# Patient Record
Sex: Female | Born: 1975 | Hispanic: Yes | Marital: Married | Smoking: Never smoker
Health system: Southern US, Community
[De-identification: ages and names within clinical notes are randomized; demographics above are authoritative.]

## PROBLEM LIST (undated history)

## (undated) DIAGNOSIS — Z789 Other specified health status: Secondary | ICD-10-CM

## (undated) HISTORY — PX: BREAST SURGERY: SHX581

## (undated) HISTORY — DX: Other specified health status: Z78.9

---

## 1999-02-08 ENCOUNTER — Emergency Department (HOSPITAL_COMMUNITY): Admission: EM | Admit: 1999-02-08 | Discharge: 1999-02-08 | Payer: Self-pay | Admitting: Emergency Medicine

## 1999-02-08 ENCOUNTER — Encounter: Payer: Self-pay | Admitting: Emergency Medicine

## 2003-06-17 ENCOUNTER — Other Ambulatory Visit: Admission: RE | Admit: 2003-06-17 | Discharge: 2003-06-17 | Payer: Self-pay | Admitting: Obstetrics and Gynecology

## 2004-02-14 ENCOUNTER — Inpatient Hospital Stay (HOSPITAL_COMMUNITY): Admission: AD | Admit: 2004-02-14 | Discharge: 2004-02-16 | Payer: Self-pay | Admitting: Obstetrics and Gynecology

## 2012-05-29 ENCOUNTER — Ambulatory Visit (INDEPENDENT_AMBULATORY_CARE_PROVIDER_SITE_OTHER): Payer: Self-pay | Admitting: *Deleted

## 2012-05-29 ENCOUNTER — Encounter: Payer: Self-pay | Admitting: *Deleted

## 2012-05-29 VITALS — BP 107/70 | Ht 63.0 in | Wt 134.0 lb

## 2012-05-29 DIAGNOSIS — O09529 Supervision of elderly multigravida, unspecified trimester: Secondary | ICD-10-CM

## 2012-05-29 DIAGNOSIS — O09521 Supervision of elderly multigravida, first trimester: Secondary | ICD-10-CM

## 2012-05-30 LAB — OBSTETRIC PANEL
Antibody Screen: NEGATIVE
Basophils Absolute: 0 10*3/uL (ref 0.0–0.1)
Basophils Relative: 0 % (ref 0–1)
Eosinophils Absolute: 0 10*3/uL (ref 0.0–0.7)
Eosinophils Relative: 0 % (ref 0–5)
HCT: 38.5 % (ref 36.0–46.0)
Hemoglobin: 13.2 g/dL (ref 12.0–15.0)
Hepatitis B Surface Ag: NEGATIVE
Lymphocytes Relative: 19 % (ref 12–46)
Lymphs Abs: 1.5 10*3/uL (ref 0.7–4.0)
MCH: 31.2 pg (ref 26.0–34.0)
MCHC: 34.3 g/dL (ref 30.0–36.0)
MCV: 91 fL (ref 78.0–100.0)
Monocytes Absolute: 0.6 10*3/uL (ref 0.1–1.0)
Monocytes Relative: 8 % (ref 3–12)
Neutro Abs: 5.7 10*3/uL (ref 1.7–7.7)
Neutrophils Relative %: 73 % (ref 43–77)
Platelets: 247 10*3/uL (ref 150–400)
RBC: 4.23 MIL/uL (ref 3.87–5.11)
RDW: 13.7 % (ref 11.5–15.5)
Rh Type: POSITIVE
Rubella: 6.58 Index — ABNORMAL HIGH (ref <0.90)
WBC: 7.9 10*3/uL (ref 4.0–10.5)

## 2012-05-30 LAB — HIV ANTIBODY (ROUTINE TESTING W REFLEX): HIV: NONREACTIVE

## 2012-05-31 LAB — CULTURE, OB URINE
Colony Count: NO GROWTH
Organism ID, Bacteria: NO GROWTH

## 2012-06-06 ENCOUNTER — Encounter: Payer: Self-pay | Admitting: Obstetrics & Gynecology

## 2012-06-09 ENCOUNTER — Encounter: Payer: Self-pay | Admitting: Family Medicine

## 2012-06-16 ENCOUNTER — Ambulatory Visit (INDEPENDENT_AMBULATORY_CARE_PROVIDER_SITE_OTHER): Payer: Self-pay | Admitting: Obstetrics & Gynecology

## 2012-06-16 ENCOUNTER — Encounter: Payer: Self-pay | Admitting: Obstetrics & Gynecology

## 2012-06-16 VITALS — BP 122/78 | Temp 98.6°F | Wt 138.6 lb

## 2012-06-16 DIAGNOSIS — O09529 Supervision of elderly multigravida, unspecified trimester: Secondary | ICD-10-CM

## 2012-06-16 NOTE — Progress Notes (Signed)
P=78, States she spotted 06/14/12- denies intercourse, states had moved heavy applicance

## 2012-06-16 NOTE — Progress Notes (Signed)
   Subjective:    Tiffany [name] is a [id number] [redacted]w[redacted]d being seen today for her first obstetrical visit.  Her obstetrical history is significant for advanced maternal age. Patient does not intend to breast feed. Pregnancy history fully reviewed.  Patient reports no complaints.  Filed Vitals:   06/16/12 0946  BP: 122/78  Temp: 98.6 F (37 C)  Weight: 138 lb 9.6 oz (62.869 kg)    HISTORY: OB History   Grav Para Term Preterm Abortions TAB SAB Ect Mult Living   4 2 2  0 1 0 1 0 0 2     # Outc Date GA Lbr Len/2nd Wgt Sex Del Anes PTL Lv   1 TRM 7/97 106w0d  5lb12oz(2.608kg) M SVD EPI No Yes   2 SAB 2000 [redacted]w[redacted]d          3 TRM 11/05 [redacted]w[redacted]d  7lb(3.175kg) F SVD EPI No Yes   4 CUR              Past Medical History  Diagnosis Date  . Medical history non-contributory    Past Surgical History  Procedure Laterality Date  . Breast surgery      implants   Family History  Problem Relation Age of Onset  . Hypertension Mother   . Hyperlipidemia Mother   . Hypertension Maternal Grandmother   . Hyperlipidemia Maternal Grandmother      Exam    Uterus:     Pelvic Exam:    Perineum: No Hemorrhoids   Vulva: normal   Vagina:  normal mucosa   pH:    Cervix: anteverted   Adnexa: normal adnexa   Bony Pelvis: android  System: Breast:  normal appearance, no masses or tenderness, s/p saline augmentation   Skin: normal coloration and turgor, no rashes    Neurologic: oriented   Extremities: normal strength, tone, and muscle mass   HEENT PERRLA   Mouth/Teeth mucous membranes moist, pharynx normal without lesions   Neck supple   Cardiovascular: regular rate and rhythm   Respiratory:  appears well, vitals normal, no respiratory distress, acyanotic, normal RR, ear and throat exam is normal, neck free of mass or lymphadenopathy, chest clear, no wheezing, crepitations, rhonchi, normal symmetric air entry   Abdomen: soft, non-tender; bowel sounds normal; no masses,  no organomegaly   Urinary:  urethral meatus normal      Assessment:    Pregnancy: [id number] Patient Active Problem List  Diagnosis  . Supervision of other normal pregnancy  . AMA (advanced maternal age) multigravida 35+        Plan:     Initial labs drawn. Prenatal vitamins. Problem list reviewed and updated. Genetic Screening discussed Quad Screen and Harm[facility]: declined.  Ultrasound discussed; fetal survey: requested and ordered.  Follow up in 4 weeks.   [name],[name]. 06/16/2012

## 2012-06-21 ENCOUNTER — Encounter (HOSPITAL_COMMUNITY): Payer: Self-pay | Admitting: *Deleted

## 2012-06-21 ENCOUNTER — Other Ambulatory Visit: Payer: Self-pay | Admitting: Obstetrics & Gynecology

## 2012-06-21 ENCOUNTER — Inpatient Hospital Stay (HOSPITAL_COMMUNITY)
Admission: AD | Admit: 2012-06-21 | Discharge: 2012-06-21 | Disposition: A | Discharge status: Home / Self Care | Payer: Self-pay | Attending: Obstetrics & Gynecology | Admitting: Obstetrics & Gynecology

## 2012-06-21 ENCOUNTER — Ambulatory Visit (HOSPITAL_COMMUNITY)
Admission: RE | Admit: 2012-06-21 | Discharge: 2012-06-21 | Disposition: A | Discharge status: Home / Self Care | Payer: Self-pay | Source: Ambulatory Visit | Attending: Obstetrics & Gynecology | Admitting: Obstetrics & Gynecology

## 2012-06-21 DIAGNOSIS — O021 Missed abortion: Secondary | ICD-10-CM | POA: Insufficient documentation

## 2012-06-21 DIAGNOSIS — O209 Hemorrhage in early pregnancy, unspecified: Secondary | ICD-10-CM | POA: Insufficient documentation

## 2012-06-21 DIAGNOSIS — Z3689 Encounter for other specified antenatal screening: Secondary | ICD-10-CM | POA: Insufficient documentation

## 2012-06-21 MED ORDER — MISOPROSTOL 200 MCG PO TABS: 200.0000 ug | ORAL_TABLET | ORAL | Status: DC

## 2012-06-21 MED ORDER — IBUPROFEN 600 MG PO TABS: 600.0000 mg | ORAL_TABLET | Freq: Four times a day (QID) | ORAL | Status: DC | PRN

## 2012-06-21 MED ORDER — OXYCODONE-ACETAMINOPHEN 5-325 MG PO TABS: 1.0000 | ORAL_TABLET | ORAL | Status: DC | PRN

## 2012-06-21 MED ORDER — PROMETHAZINE HCL 12.5 MG PO TABS: 12.5000 mg | ORAL_TABLET | Freq: Four times a day (QID) | ORAL | Status: DC | PRN

## 2012-06-21 NOTE — MAU Provider Note (Signed)
Chief Complaint: No fetal heart beat    First Provider Initiated Contact with Patient 06/21/12 1901     SUBJECTIVE HPI: Tiffany [name] is a 37 y.o. [id number] at [redacted]w[redacted]d by LMP who had routine [place] today showing fetal pole [redacted]w[redacted]d by CRL and absent cardiac activity. She had care at [facility] 05/29/12 for PN labs ( O pos) and 06/16/12 for NOB exam. She had one episode of light pink spotting about a week ago. No cramping or illness. Planned pregnancy.  Conferred with Dr. Shawnie Pons earlier today after the [place], called [facility] and was told to come here.   Past Medical History  Diagnosis Date  . Medical history non-contributory    OB History   Grav Para Term Preterm Abortions TAB SAB Ect Mult Living   4 2 2  0 1 0 1 0 0 2     # Outc Date GA Lbr Len/2nd Wgt Sex Del Anes PTL Lv   1 TRM 7/97 [redacted]w[redacted]d  2.608kg(5lb12oz) M SVD EPI No Yes   2 SAB 2000 [redacted]w[redacted]d          3 TRM 11/05 [redacted]w[redacted]d  3.175kg(7lb) F SVD EPI No Yes   4 CUR              Past Surgical History  Procedure Laterality Date  . Breast surgery      implants   History   Social History  . Marital Status: Married    Spouse Name: N/A    Number of Children: N/A  . Years of Education: N/A   Occupational History  . Not on file.   Social History Main Topics  . Smoking status: Never Smoker   . Smokeless tobacco: Never Used  . Alcohol Use: No  . Drug Use: No  . Sexually Active: Yes    Birth Control/ Protection: None   Other Topics Concern  . Not on file   Social History Narrative  . No narrative on file   No current facility-administered medications on file prior to encounter.   Current Outpatient Prescriptions on File Prior to Encounter  Medication Sig Dispense Refill  . calcium carbonate (TUMS - DOSED IN MG ELEMENTAL CALCIUM) 500 MG chewable tablet Chew 1 tablet by mouth daily.      . Docosahexaenoic Acid (PRENATAL DHA PO) Take by mouth.      . famotidine (PEPCID) 20 MG tablet Take 20 mg by mouth 2 (two) times daily.       No Known  Allergies  ROS: Pertinent items in HPI  OBJECTIVE Blood pressure 123/72, pulse 98, temperature 97.3 F (36.3 C), temperature source Oral, resp. rate 18, last menstrual period 02/28/2012. GENERAL: Well-developed, well-nourished female emotionally distressed.  HEENT: Normocephalic HEART: normal rate RESP: normal effort ABDOMEN: Soft, non-tender EXTREMITIES: Nontender, no edema NEURO: Alert and oriented  LAB RESULTS No results found for this or any previous visit (from the past 24 hour(s)).  IMAGING US Ob Comp Less 14 Wks  06/21/2012  OBSTETRICAL ULTRASOUND: This exam was performed within a [facility]. The OB US report was generated in the [facility] system, and faxed to the ordering physician.   This report is also available in [employer] and in the [employer]. See [facility] Obstetric US report.    MAU COURSE Counseled at length about options including expectant management, cytotec, D&C. All questions answered and they elect cytotetc.  ASSESSMENT 1. AMA (advanced maternal age) multigravida 35+, first trimester   2. [name]  37 yo [id number] with [redacted]w[redacted]d demise   PLAN Discharge home    Medication List    TAKE these medications       calcium carbonate 500 MG chewable tablet  Commonly known as:  TUMS - dosed in mg elemental calcium  Chew 1 tablet by mouth daily.     famotidine 20 MG tablet  Commonly known as:  PEPCID  Take 20 mg by mouth 2 (two) times daily.     ibuprofen 600 MG tablet  Commonly known as:  ADVIL,MOTRIN  Take 1 tablet (600 mg total) by mouth every 6 (six) hours as needed for pain.     misoprostol 200 MCG tablet  Commonly known as:  CYTOTEC  Take 1 tablet (200 mcg total) by mouth 1 day or 1 dose.     oxyCODONE-acetaminophen 5-325 MG per tablet  Commonly known as:  PERCOCET/ROXICET  Take 1 tablet by mouth every 4 (four) hours as needed for pain.     PRENATAL DHA PO  Take by mouth.     promethazine 12.5 MG  tablet  Commonly known as:  PHENERGAN  Take 1 tablet (12.5 mg total) by mouth every 6 (six) hours as needed for nausea.            Early Intrauterine Pregnancy Failure  _x__  Documented intrauterine pregnancy failure less than or equal to [redacted] weeks gestation  _x_  No serious current illness  _x__  Baseline Hgb greater than or equal to 10g/dl  _x__  Patient has easily accessible transportation to the hospital  _x__  Clear preference  _x__  Practitioner/physician deems patient reliable  _x__  Counseling by practitioner or physician  _x__  Patient education by RN  __x_  Consent form signed  _NI__  Rho-Gam given by RN if indicated  _x__ Medication dispensed   _x__   Cytotec 800 mcg  x_   Intravaginally by patient at home         __   Intravaginally by RN in MAU        __   Rectally by patient at home        __   Rectally by RN in MAU  _x__  Ibuprofen 600 mg 1 tablet by mouth every 6 hours as needed #30  _x__  Hydrocodone/acetaminophen 5/325 mg by mouth every 4 to 6 hours as needed  _x__  Phenergan 12.5 mg by mouth every 4 hours as needed for nausea  Follow-up Information   Follow up with [facility] In 2 weeks. (Someone from the office will call with an appointment)    Contact information:   [address] [place] [address] [phone]        [name], CNM 06/21/2012  7:15 PM

## 2012-06-21 NOTE — MAU Note (Signed)
[facility], [facility] called her and told her there was no heart beat and to come into the hospital.

## 2012-06-21 NOTE — MAU Note (Signed)
Comfort material given to pt.  Miscarriage discharge instructions reviewed in detail.  Pt voices understanding, tearful about loss.

## 2012-07-14 ENCOUNTER — Encounter: Payer: Self-pay | Admitting: Obstetrics & Gynecology

## 2012-07-26 ENCOUNTER — Ambulatory Visit (INDEPENDENT_AMBULATORY_CARE_PROVIDER_SITE_OTHER): Payer: Self-pay | Admitting: Family Medicine

## 2012-07-26 ENCOUNTER — Ambulatory Visit (HOSPITAL_COMMUNITY)
Admission: RE | Admit: 2012-07-26 | Discharge: 2012-07-26 | Disposition: A | Discharge status: Home / Self Care | Payer: MEDICAID | Source: Ambulatory Visit | Attending: Family Medicine | Admitting: Family Medicine

## 2012-07-26 ENCOUNTER — Encounter: Payer: Self-pay | Admitting: Family Medicine

## 2012-07-26 VITALS — BP 132/94 | HR 82 | Ht 63.0 in | Wt 139.0 lb

## 2012-07-26 DIAGNOSIS — N949 Unspecified condition associated with female genital organs and menstrual cycle: Secondary | ICD-10-CM | POA: Insufficient documentation

## 2012-07-26 DIAGNOSIS — IMO0002 Reserved for concepts with insufficient information to code with codable children: Secondary | ICD-10-CM | POA: Insufficient documentation

## 2012-07-26 NOTE — Patient Instructions (Signed)
Miscarriage A miscarriage is the sudden loss of an unborn baby (fetus) before the 20th week of pregnancy. Most miscarriages happen in the first 3 months of pregnancy. Sometimes, it happens before a woman even knows she is pregnant. A miscarriage is also called a "spontaneous miscarriage" or "early pregnancy loss." Having a miscarriage can be an emotional experience. Talk with your caregiver about any questions you may have about miscarrying, the grieving process, and your future pregnancy plans. CAUSES   Problems with the fetal chromosomes that make it impossible for the baby to develop normally. Problems with the baby's genes or chromosomes are most often the result of errors that occur, by chance, as the embryo divides and grows. The problems are not inherited from the parents.  Infection of the cervix or uterus.   Hormone problems.   Problems with the cervix, such as having an incompetent cervix. This is when the tissue in the cervix is not strong enough to hold the pregnancy.   Problems with the uterus, such as an abnormally shaped uterus, uterine fibroids, or congenital abnormalities.   Certain medical conditions.   Smoking, drinking alcohol, or taking illegal drugs.   Trauma.  Often, the cause of a miscarriage is unknown.  SYMPTOMS   Vaginal bleeding or spotting, with or without cramps or pain.  Pain or cramping in the abdomen or lower back.  Passing fluid, tissue, or blood clots from the vagina. DIAGNOSIS  Your caregiver will perform a physical exam. You may also have an ultrasound to confirm the miscarriage. Blood or urine tests may also be ordered. TREATMENT   Sometimes, treatment is not necessary if you naturally pass all the fetal tissue that was in the uterus. If some of the fetus or placenta remains in the body (incomplete miscarriage), tissue left behind may become infected and must be removed. Usually, a dilation and curettage (D and C) procedure is performed.  During a D and C procedure, the cervix is widened (dilated) and any remaining fetal or placental tissue is gently removed from the uterus.  Antibiotic medicines are prescribed if there is an infection. Other medicines may be given to reduce the size of the uterus (contract) if there is a lot of bleeding.  If you have Rh negative blood and your baby was Rh positive, you will need a Rh immunoglobulin shot. This shot will protect any future baby from having Rh blood problems in future pregnancies. HOME CARE INSTRUCTIONS   Your caregiver may order bed rest or may allow you to continue light activity. Resume activity as directed by your caregiver.  Have someone help with home and family responsibilities during this time.   Keep track of the number of sanitary pads you use each day and how soaked (saturated) they are. Write down this information.   Do not use tampons. Do not douche or have sexual intercourse until approved by your caregiver.   Only take over-the-counter or prescription medicines for pain or discomfort as directed by your caregiver.   Do not take aspirin. Aspirin can cause bleeding.   Keep all follow-up appointments with your caregiver.   If you or your partner have problems with grieving, talk to your caregiver or seek counseling to help cope with the pregnancy loss. Allow enough time to grieve before trying to get pregnant again.  SEEK IMMEDIATE MEDICAL CARE IF:   You have severe cramps or pain in your back or abdomen.  You have a fever.  You pass large blood clots (walnut-sized   or larger) ortissue from your vagina. Save any tissue for your caregiver to inspect.   Your bleeding increases.   You have a thick, bad-smelling vaginal discharge.  You become lightheaded, weak, or you faint.   You have chills.  MAKE SURE YOU:  Understand these instructions.  Will watch your condition.  Will get help right away if you are not doing well or get  worse. Document Released: 09/15/2000 Document Revised: 09/21/2011 Document Reviewed: 05/11/2011 [employer] Patient Information 2013 [place], [place]. Abnormal Uterine Bleeding Abnormal uterine bleeding can have many causes. Some cases are simply treated, while others are more serious. There are several kinds of bleeding that is considered abnormal, including:  Bleeding between periods.  Bleeding after sexual intercourse.  Spotting anytime in the menstrual cycle.  Bleeding heavier or more than normal.  Bleeding after menopause. CAUSES  There are many causes of abnormal uterine bleeding. It can be present in teenagers, pregnant women, women during their reproductive years, and women who have reached menopause. Your caregiver will look for the more common causes depending on your age, signs, symptoms and your particular circumstance. Most cases are not serious and can be treated. Even the more serious causes, like cancer of the female organs, can be treated adequately if found in the early stages. That is why all types of bleeding should be evaluated and treated as soon as possible. DIAGNOSIS  Diagnosing the cause may take several kinds of tests. Your caregiver may:  Take a complete history of the type of bleeding.  Perform a complete physical exam and Pap smear.  Take an ultrasound on the abdomen showing a picture of the female organs and the pelvis.  Inject dye into the uterus and Fallopian tubes and X-ray them (hysterosalpingogram).  Place fluid in the uterus and do an ultrasound (sonohysterogrqphy).  Take a CT scan to examine the female organs and pelvis.  Take an MRI to examine the female organs and pelvis. There is no X-ray involved with this procedure.  Look inside the uterus with a telescope that has a light at the end (hysteroscopy).  Scrap the inside of the uterus to get tissue to examine (Dilatation and Curettage, D&C).  Look into the pelvis with a telescope that has a  light at the end (laparoscopy). This is done through a very small cut (incision) in the abdomen. TREATMENT  Treatment will depend on the cause of the abnormal bleeding. It can include:  Doing nothing to allow the problem to take care of itself over time.  Hormone treatment.  Birth control pills.  Treating the medical condition causing the problem.  Laparoscopy.  Major or minor surgery  Destroying the lining of the uterus with electrical currant, laser, freezing or heat (uterine ablation). HOME CARE INSTRUCTIONS   Follow your caregiver's recommendation on how to treat your problem.  See your caregiver if you missed a menstrual period and think you may be pregnant.  If you are bleeding heavily, count the number of pads/tampons you use and how often you have to change them. Tell this to your caregiver.  Avoid sexual intercourse until the problem is controlled. SEEK MEDICAL CARE IF:   You have any kind of abnormal bleeding mentioned above.  You feel dizzy at times.  You are 37 years old and have not had a menstrual period yet. SEEK IMMEDIATE MEDICAL CARE IF:   You pass out.  You are changing pads/tampons every 15 to 30 minutes.  You have belly (abdominal) pain.  You have  a temperature of 100 F (37.8 C) or higher.  You become sweaty or weak.  You are passing large blood clots from the vagina.  You start to feel sick to your stomach (nauseous) and throw up (vomit). Document Released: 03/22/2005 Document Revised: 06/14/2011 Document Reviewed: 08/15/2008 [facility] Patient Information 2013 [place], [place].

## 2012-07-26 NOTE — Progress Notes (Signed)
Patient ID: Tiffany [name], female   DOB: January 17, 1976, 37 y.o.   MRN: [id number]  Called pt. By phone to discuss options--appears to have retained POC by u/s--small amount 1.5 cm.  Advised of 2nd round of cytotec vs D and C.  Risks of each discussed.  She will discuss with partner and call us with her decision in the am.

## 2012-07-26 NOTE — Progress Notes (Signed)
  Subjective:    Patient ID: Tiffany [name], female    DOB: [birth year], 37 y.o.   MRN: [id number]  HPI Had 9 wk SAB diagnosed at anatomy scan.  S/p cytotec with heavy bleeding and tissue passage.  No bleeding for a couple of weeks, then now with continued bleeding, worse with activity and pain.     Review of Systems  Constitutional: Negative for fever and chills.  Gastrointestinal: Positive for abdominal pain. Negative for nausea and vomiting.  Genitourinary: Positive for pelvic pain.       Objective:   Physical Exam  Constitutional: She appears well-developed and well-nourished.  HENT:  Head: Normocephalic and atraumatic.  Neck: Neck supple.  Cardiovascular: Normal rate.   Pulmonary/Chest: Effort normal.  Abdominal: Soft.          Assessment & Plan:

## 2012-07-26 NOTE — Assessment & Plan Note (Signed)
?   Retained POC.  Will check U/s and phone with results and next steps.  Discussed no pregnancy for 2-3 cycles.  She is not interested in [facility] at this time.  Risks associated with age discussed.

## 2012-07-27 ENCOUNTER — Telehealth: Payer: Self-pay

## 2012-07-27 NOTE — Telephone Encounter (Signed)
Scheduled for D andC on 07/28/2012 at 2:30.  Pt. Called and instructed to be NPO for 8 hours prior to surgery and be here at 1 pm.

## 2012-07-27 NOTE — Telephone Encounter (Signed)
Hi Dr. Shawnie Pons,  This patient had her ultrasound done yesterday and was told she still has some tissue discharge coming out from her SAB . She wants to go ahead to have a D&C done and she would like for you to perform it please. Please let me know when and what time this would be scheduled so I can call patient and advice, thanks :)

## 2012-07-28 ENCOUNTER — Ambulatory Visit (HOSPITAL_COMMUNITY): Payer: Self-pay | Admitting: Anesthesiology

## 2012-07-28 ENCOUNTER — Encounter (HOSPITAL_COMMUNITY): Payer: Self-pay | Admitting: *Deleted

## 2012-07-28 ENCOUNTER — Ambulatory Visit (HOSPITAL_COMMUNITY)
Admission: RE | Admit: 2012-07-28 | Discharge: 2012-07-28 | Disposition: A | Discharge status: Home / Self Care | Payer: MEDICAID | Source: Ambulatory Visit | Attending: Family Medicine | Admitting: Family Medicine

## 2012-07-28 ENCOUNTER — Encounter (HOSPITAL_COMMUNITY): Payer: Self-pay | Admitting: Anesthesiology

## 2012-07-28 ENCOUNTER — Encounter (HOSPITAL_COMMUNITY)
Admission: RE | Disposition: A | Discharge status: Home / Self Care | Payer: Self-pay | Source: Ambulatory Visit | Attending: Family Medicine

## 2012-07-28 DIAGNOSIS — O021 Missed abortion: Secondary | ICD-10-CM | POA: Insufficient documentation

## 2012-07-28 HISTORY — PX: DILATION AND EVACUATION: SHX1459

## 2012-07-28 LAB — CBC
HCT: 41.3 % (ref 36.0–46.0)
MCH: 31.5 pg (ref 26.0–34.0)
MCHC: 34.4 g/dL (ref 30.0–36.0)
MCV: 91.6 fL (ref 78.0–100.0)
RDW: 12.3 % (ref 11.5–15.5)

## 2012-07-28 SURGERY — DILATION AND EVACUATION, UTERUS
Anesthesia: Monitor Anesthesia Care | Site: Vagina | Wound class: Clean Contaminated

## 2012-07-28 MED ORDER — DOXYCYCLINE HYCLATE 100 MG PO CAPS: 100.0000 mg | ORAL_CAPSULE | Freq: Two times a day (BID) | ORAL | Status: DC

## 2012-07-28 MED ORDER — LIDOCAINE HCL (CARDIAC) 20 MG/ML IV SOLN
INTRAVENOUS | Status: AC
  Filled 2012-07-28: qty 5

## 2012-07-28 MED ORDER — DEXAMETHASONE SODIUM PHOSPHATE 10 MG/ML IJ SOLN
INTRAMUSCULAR | Status: AC
  Filled 2012-07-28: qty 1

## 2012-07-28 MED ORDER — ONDANSETRON HCL 4 MG/2ML IJ SOLN
INTRAMUSCULAR | Status: DC | PRN
  Administered 2012-07-28: 4 mg via INTRAVENOUS

## 2012-07-28 MED ORDER — FENTANYL CITRATE 0.05 MG/ML IJ SOLN
INTRAMUSCULAR | Status: AC
  Filled 2012-07-28: qty 5

## 2012-07-28 MED ORDER — ONDANSETRON HCL 4 MG/2ML IJ SOLN
INTRAMUSCULAR | Status: AC
  Filled 2012-07-28: qty 2

## 2012-07-28 MED ORDER — KETOROLAC TROMETHAMINE 30 MG/ML IJ SOLN
INTRAMUSCULAR | Status: DC | PRN
  Administered 2012-07-28: 30 mg via INTRAVENOUS

## 2012-07-28 MED ORDER — DEXAMETHASONE SODIUM PHOSPHATE 10 MG/ML IJ SOLN
INTRAMUSCULAR | Status: DC | PRN
  Administered 2012-07-28: 10 mg via INTRAVENOUS

## 2012-07-28 MED ORDER — LACTATED RINGERS IV SOLN
INTRAVENOUS | Status: DC
  Administered 2012-07-28: 125 mL/h via INTRAVENOUS

## 2012-07-28 MED ORDER — KETOROLAC TROMETHAMINE 30 MG/ML IJ SOLN
INTRAMUSCULAR | Status: AC
  Filled 2012-07-28: qty 1

## 2012-07-28 MED ORDER — FENTANYL CITRATE 0.05 MG/ML IJ SOLN
INTRAMUSCULAR | Status: DC | PRN
  Administered 2012-07-28 (×3): 50 ug via INTRAVENOUS

## 2012-07-28 MED ORDER — FENTANYL CITRATE 0.05 MG/ML IJ SOLN: 25.0000 ug | INTRAMUSCULAR | Status: DC | PRN

## 2012-07-28 MED ORDER — MIDAZOLAM HCL 5 MG/5ML IJ SOLN
INTRAMUSCULAR | Status: DC | PRN
  Administered 2012-07-28: 2 mg via INTRAVENOUS

## 2012-07-28 MED ORDER — PROPOFOL 10 MG/ML IV BOLUS
INTRAVENOUS | Status: DC | PRN
  Administered 2012-07-28: 20 mg via INTRAVENOUS

## 2012-07-28 MED ORDER — BUPIVACAINE-EPINEPHRINE PF 0.25-1:200000 % IJ SOLN
INTRAMUSCULAR | Status: AC
  Filled 2012-07-28: qty 30

## 2012-07-28 MED ORDER — LIDOCAINE HCL (CARDIAC) 20 MG/ML IV SOLN
INTRAVENOUS | Status: DC | PRN
  Administered 2012-07-28: 40 mg via INTRAVENOUS

## 2012-07-28 MED ORDER — DOXYCYCLINE HYCLATE 100 MG IV SOLR
100.0000 mg | Freq: Once | INTRAVENOUS | Status: AC
  Administered 2012-07-28: 100 mg via INTRAVENOUS
  Filled 2012-07-28: qty 100

## 2012-07-28 MED ORDER — PROPOFOL 10 MG/ML IV EMUL
INTRAVENOUS | Status: AC
  Filled 2012-07-28: qty 20

## 2012-07-28 MED ORDER — MIDAZOLAM HCL 2 MG/2ML IJ SOLN
INTRAMUSCULAR | Status: AC
  Filled 2012-07-28: qty 2

## 2012-07-28 MED ORDER — BUPIVACAINE-EPINEPHRINE 0.25% -1:200000 IJ SOLN
INTRAMUSCULAR | Status: DC | PRN
  Administered 2012-07-28: 20 mL

## 2012-07-28 SURGICAL SUPPLY — 22 items
CATH ROBINSON RED A/P 16FR (CATHETERS) ×2 IMPLANT
CLOTH BEACON ORANGE TIMEOUT ST (SAFETY) ×2 IMPLANT
DECANTER SPIKE VIAL GLASS SM (MISCELLANEOUS) ×2 IMPLANT
GLOVE BIOGEL PI IND STRL 7.0 (GLOVE) ×1 IMPLANT
GLOVE BIOGEL PI INDICATOR 7.0 (GLOVE) ×1
GLOVE ECLIPSE 7.0 STRL STRAW (GLOVE) ×4 IMPLANT
GOWN PREVENTION PLUS XLARGE (GOWN DISPOSABLE) ×2 IMPLANT
GOWN STRL REIN XL XLG (GOWN DISPOSABLE) ×4 IMPLANT
KIT BERKELEY 1ST TRIMESTER 3/8 (MISCELLANEOUS) ×2 IMPLANT
NDL SPNL 22GX3.5 QUINCKE BK (NEEDLE) ×1 IMPLANT
NEEDLE SPNL 22GX3.5 QUINCKE BK (NEEDLE) ×2 IMPLANT
NS IRRIG 1000ML POUR BTL (IV SOLUTION) ×2 IMPLANT
PACK VAGINAL MINOR WOMEN LF (CUSTOM PROCEDURE TRAY) ×2 IMPLANT
PAD OB MATERNITY 4.3X12.25 (PERSONAL CARE ITEMS) ×2 IMPLANT
PAD PREP 24X48 CUFFED NSTRL (MISCELLANEOUS) ×2 IMPLANT
SET BERKELEY SUCTION TUBING (SUCTIONS) ×2 IMPLANT
SYR CONTROL 10ML LL (SYRINGE) ×2 IMPLANT
TOWEL OR 17X24 6PK STRL BLUE (TOWEL DISPOSABLE) ×4 IMPLANT
VACURETTE 10 RIGID CVD (CANNULA) IMPLANT
VACURETTE 7MM CVD STRL WRAP (CANNULA) IMPLANT
VACURETTE 8 RIGID CVD (CANNULA) IMPLANT
VACURETTE 9 RIGID CVD (CANNULA) ×1 IMPLANT

## 2012-07-28 NOTE — Transfer of Care (Signed)
Immediate Anesthesia Transfer of Care Note  Patient: Tiffany [name]  Procedure(s) Performed: Procedure(s): DILATATION AND EVACUATION (N/A)  Patient Location: PACU  Anesthesia Type:MAC  Level of Consciousness: sedated  Airway & Oxygen Therapy: Patient Spontanous Breathing  Post-op Assessment: Report given to PACU RN  Post vital signs: Reviewed  Complications: No apparent anesthesia complications

## 2012-07-28 NOTE — Preoperative (Signed)
Beta Blockers   Reason not to administer Beta Blockers:Not Applicable 

## 2012-07-28 NOTE — Anesthesia Postprocedure Evaluation (Signed)
  Anesthesia Post-op Note  Patient: Tiffany [name]  Procedure(s) Performed: Procedure(s): DILATATION AND EVACUATION (N/A) Patient is awake and responsive. Pain and nausea are reasonably well controlled. Vital signs are stable and clinically acceptable. Oxygen saturation is clinically acceptable. There are no apparent anesthetic complications at this time. Patient is ready for discharge.

## 2012-07-28 NOTE — H&P (Signed)
Tiffany [name] is an 37 y.o. [phone] Unknown female.   Chief Complaint: Bleeding HPI: 43 y.M.Tiffany [name] with missed AB at 9 wks, who had cytotec and continued bleeding and pain x 4 wks after cytotec.  Past Medical History  Diagnosis Date  . Medical history non-contributory     Past Surgical History  Procedure Laterality Date  . Breast surgery      implants    Family History  Problem Relation Age of Onset  . Hypertension Mother   . Hyperlipidemia Mother   . Hypertension Maternal Grandmother   . Hyperlipidemia Maternal Grandmother    Social History:  reports that she has never smoked. She has never used smokeless tobacco. She reports that she does not drink alcohol or use illicit drugs.  Allergies: No Known Allergies  Medications Prior to Admission  Medication Sig Dispense Refill  . calcium carbonate (TUMS - DOSED IN MG ELEMENTAL CALCIUM) 500 MG chewable tablet Chew 1 tablet by mouth daily.      . Docosahexaenoic Acid (PRENATAL DHA PO) Take by mouth.      . famotidine (PEPCID) 20 MG tablet Take 20 mg by mouth 2 (two) times daily.      Marland Kitchen ibuprofen (ADVIL,MOTRIN) 600 MG tablet Take 1 tablet (600 mg total) by mouth every 6 (six) hours as needed for pain.  30 tablet  1     Pertinent items are noted in HPI.  Blood pressure 102/86, pulse 101, temperature 98.4 F (36.9 C), temperature source Oral, resp. rate 20, height 5\' 3"  (1.6 m), weight 140 lb (63.504 kg), last menstrual period 02/28/2012, SpO2 100.00%. BP 102/86  Pulse 101  Temp(Src) 98.4 F (36.9 C) (Oral)  Resp 20  Ht 5\' 3"  (1.6 m)  Wt 140 lb (63.504 kg)  BMI 24.81 kg/m2  SpO2 100%  LMP 02/28/2012 General appearance: alert, cooperative and appears stated age Head: Normocephalic, without obvious abnormality, atraumatic Neck: no adenopathy, supple, symmetrical, trachea midline and thyroid not enlarged, symmetric, no tenderness/mass/nodules Lungs: clear to auscultation bilaterally Heart: regular rate and rhythm, S1,  S2 normal, no murmur, click, rub or gallop Abdomen: soft, non-tender; bowel sounds normal; no masses,  no organomegaly Pelvic: cervix normal in appearance, external genitalia normal, no adnexal masses or tenderness, uterus normal size, shape, and consistency, vagina normal without discharge and uterus is tender. Extremities: extremities normal, atraumatic, no cyanosis or edema Skin: Skin color, texture, turgor normal. No rashes or lesions Neurologic: Grossly normal   Lab Results  Component Value Date   WBC 8.0 07/28/2012   HGB 14.2 07/28/2012   HCT 41.3 07/28/2012   MCV 91.6 07/28/2012   PLT 250 07/28/2012   No results found for this basename: PREGTESTUR, PREGSERUM, HCG, HCGQUANT    US Transvaginal Non-ob  07/26/2012  *RADIOLOGY REPORT*  Clinical Data: Persistent vaginal bleeding pelvic pain.  Recent Cytotec for missed abortion.  Evaluate for retained products.  TRANSVAGINAL ULTRASOUND OF PELVIS  Technique:  Transvaginal ultrasound examination of the pelvis was performed including evaluation of the uterus, ovaries, adnexal regions, and pelvic cul-de-sac.  Comparison:  06/21/2012  Findings:  Uterus:     8.9 x 4.8 x 6.0 cm.  No fibroids identified.  Endometrium:      Thickened and inhomogeneous endometrial stripe is seen which measures up to 1.8 cm transvaginally.  Increased vascularity is also seen in the fundal portion of the endometrial cavity on color Dopplerultrasound.  These findings are suggestive of retained products of conception.  Right Ovary: 3.6 x 2.3 x  2.0 cm.  Normal appearance.  Left Ovary: 2.8 x 1.5 x 2.0 cm.  Normal appearance.  Other Findings:  No free fluid.  IMPRESSION:  1.  Thickened endometrium with internal vascularity in the fundal region, suspicious for retained products of conception. 2.  No fibroids or adnexal mass identified.   Original Report Authenticated By: Myles Rosenthal, M.D.    Assessment/Plan Patient Active Problem List  Diagnosis  . Other disorder of menstruation  and other abnormal bleeding from female genital tract  . Retained products of conception with hemorrhage  Suction Dilation and Curettage. Risks include but are not limited to bleeding, infection, injury to surrounding structures, including bowel, bladder and ureters, blood clots, and death.  Likelihood of success is high.   Tiffany [name] S 07/28/2012, 2:08 PM

## 2012-07-28 NOTE — Anesthesia Preprocedure Evaluation (Signed)
Anesthesia Evaluation  Patient identified by MRN, date of birth, ID band Patient awake    Reviewed: Allergy & Precautions, H&P , Patient's Chart, lab work & pertinent test results, reviewed documented beta blocker date and time   Airway Mallampati: II TM Distance: >3 FB Neck ROM: full    Dental no notable dental hx.    Pulmonary  breath sounds clear to auscultation  Pulmonary exam normal       Cardiovascular Rhythm:regular Rate:Normal     Neuro/Psych    GI/Hepatic   Endo/Other    Renal/GU      Musculoskeletal   Abdominal   Peds  Hematology   Anesthesia Other Findings   Reproductive/Obstetrics                           Anesthesia Physical Anesthesia Plan  ASA: II  Anesthesia Plan: MAC   Post-op Pain Management:    Induction: Intravenous  Airway Management Planned: LMA, Mask and Natural Airway  Additional Equipment:   Intra-op Plan:   Post-operative Plan:   Informed Consent: I have reviewed the patients History and Physical, chart, labs and discussed the procedure including the risks, benefits and alternatives for the proposed anesthesia with the patient or authorized representative who has indicated his/her understanding and acceptance.   Dental Advisory Given  Plan Discussed with: CRNA and Surgeon  Anesthesia Plan Comments:         Anesthesia Quick Evaluation  

## 2012-07-28 NOTE — Op Note (Signed)
Tiffany [name]  PROCEDURE DATE: 07/28/2012  PREOPERATIVE DIAGNOSIS: Retained POC after cytotec for missed AB  POSTOPERATIVE DIAGNOSIS: The same.  PROCEDURE:    Suction Dilation and Evacuation.  SURGEON:  PRATT,TANYA S  ANESTHESIA: Jiles Garter, MD  INDICATIONS: 37 y.o. G4P2012with MAB at [redacted] weeks gestation treated with Cytotec 4 wks ago, found to have retained POC by U/S on 2 days ago., needing surgical completion.  Risks of surgery were discussed with the patient including but not limited to: bleeding which may require transfusion; infection which may require antibiotics; injury to uterus or surrounding organs;need for additional procedures including laparotomy or laparoscopy; possibility of intrauterine scarring which may impair future fertility; and other postoperative/anesthesia complications. Written informed consent was obtained.    FINDINGS:  A 9 wk size anteverted/midline/retroverted uterus, moderate amounts of products of conception, specimen sent to pathology.  ANESTHESIA:    Monitored intravenous sedation, paracervical block.  ESTIMATED BLOOD LOSS:  Less than 20 ml.  SPECIMENS:  Products of conception sent to pathology  COMPLICATIONS:  None immediate.  PROCEDURE DETAILS:  The patient received intravenous antibiotics while in the preoperative area.  She was then taken to the operating room where general anesthesia was administered and was found to be adequate.  After an adequate timeout was performed, she was placed in the dorsal lithotomy position and examined; then prepped and draped in the sterile manner.   Her bladder was catheterized for an unmeasured amount of clear, yellow urine. A vaginal speculum was then placed in the patient's vagina and a single tooth tenaculum was applied to the anterior lip of the cervix.  A paracervical block using 1% Lidocaine with Epinephrine was administered. The cervix was gently dilated to accommodate a 9 mm suction curette that was  gently advanced to the uterine fundus.  The suction device was then activated and curette slowly rotated to clear the uterus of products of conception.  A sharp curettage was then performed to confirm complete emptying of the uterus.There was minimal bleeding noted and the tenaculum removed with good hemostasis noted. The patient tolerated the procedure well.  The patient was taken to the recovery area in stable condition.  PRATT,TANYA S 07/28/2012 2:34 PM

## 2012-08-01 ENCOUNTER — Encounter (HOSPITAL_COMMUNITY): Payer: Self-pay | Admitting: Family Medicine

## 2012-08-09 ENCOUNTER — Ambulatory Visit (INDEPENDENT_AMBULATORY_CARE_PROVIDER_SITE_OTHER): Payer: Self-pay | Admitting: Family Medicine

## 2012-08-09 ENCOUNTER — Encounter: Payer: Self-pay | Admitting: Family Medicine

## 2012-08-09 DIAGNOSIS — Z09 Encounter for follow-up examination after completed treatment for conditions other than malignant neoplasm: Secondary | ICD-10-CM

## 2012-08-09 NOTE — Patient Instructions (Signed)
Preparing for Pregnancy  Preparing for pregnancy (preconceptual care) by getting counseling and information from your caregiver before getting pregnant is a good idea. It will help you and your baby have a better chance to have a healthy, safe pregnancy and delivery of your baby. Make an appointment with your caregiver to talk about your health, medical, and family history and how to prepare yourself before getting pregnant. Your caregiver will do a complete physical exam and a Pap test. They will want to know:  · About you, your spouse or partner, and your family's medical and genetic history.  · If you are eating a balanced diet and drinking enough fluids.  · What vitamins and mineral supplements you are taking. This includes taking folic acid before getting pregnant to help prevent birth defects.  · What medications you are taking including prescription, over-the-counter and herbal medications.  · If there is any substance abuse like alcohol, smoking, and illegal drugs.  · If there is any mental or physical domestic violence.  · If there is any risk of sexually transmitted disease between you and your partner.  · What immunizations and vaccinations you have had and what you may need before getting pregnant.  · If you should get tested for HIV infection.  · If there is any exposure to chemical or toxic substances at home or work.  · If there are medical problems you have that need to be treated and kept under control before getting pregnant such as diabetes, high blood pressure or others.  · If there were any past surgeries, pregnancies and problems with them.  · What your current weight is and to set a goal as to how much weight you should gain while pregnant. Also, they will check if you should lose or gain weight before getting pregnant.  · What is your exercise routine and what it is safe when you are pregnant.  · If there are any physical disabilities that need to be addressed.  · About spacing your  pregnancies when there are other children.  · If there is a financial problem that may affect you having a child.  After talking about the above points with your caregiver, your caregiver will give you advice on how to help treat and work with you on solving any issues, if necessary, before getting pregnant. The goal is to have a healthy and safe pregnancy for you and your baby. You should keep an accurate record of your menstrual periods because it will help in determining your due date.  Immunizations that you should have before getting pregnant:   · Regular measles, German measles (rubella) and mumps.  · Tetanus and diphtheria.  · Chickenpox, if not immune.  · Herpes zoster (Varicella) if not immune.  · Human papilloma virus vaccine (HPV) between the age of 9 and 26 years old.  · Hepatitis A vaccine.  · Hepatitis B vaccine.  · Influenza vaccine.  · Pneumococcal vaccine (pneumonia).  You should avoid getting pregnant for one month after getting vaccinated with a live virus vaccine such as German measles (rubella) vaccine. Other immunizations may be necessary depending on where you live, such as malaria. Ask your caregiver if any other immunizations are needed for you.  HOME CARE INSTRUCTIONS   · Follow the advice of your caregiver.  · Before getting pregnant:  · Begin taking vitamins, supplements, and 0.4 milligrams folic acid daily.  · Get your immunizations up-to-date.  · Get help from a nutrition counselor   if you do not understand what a balanced diet is, need help with a special medical diet or if you need help to lose or gain weight.  · Begin exercising.  · Stop smoking, taking illegal drugs, and drinking alcoholic beverages.  · Get counseling if there is and type of domestic violence.  · Get checked for sexually transmitted diseases including HIV.  · Get any medical problems under control (diabetes, high blood pressure, convulsions, asthma or others).  · Resolve any financial concerns.  · Be sure you and  your spouse or partner are ready to have a baby.  · Keep an accurate record of your menstrual periods.  Document Released: 03/04/2008 Document Revised: 06/14/2011 Document Reviewed: 03/04/2008  ExitCare® Patient Information ©2013 [employer].

## 2012-08-09 NOTE — Progress Notes (Signed)
  Subjective:    Patient ID: Tiffany [name], female    DOB: September 09, 1975, 37 y.o.   MRN: [id number]  HPI  Here for f/u.  S/p D & C for retained POC following cytotec for missed AB.  Feels well.  Slightly dizzy.  No further bleeding.  Pathology showed retained POC.  Reviewed with pt.  Review of Systems  Constitutional: Negative for fever and chills.  Gastrointestinal: Negative for nausea, vomiting and abdominal pain.  Genitourinary: Negative for vaginal bleeding and vaginal pain.       Objective:   Physical Exam  Vitals reviewed. Constitutional: She appears well-developed and well-nourished.  HENT:  Head: Normocephalic and atraumatic.  Eyes: No scleral icterus.  Neck: Neck supple.  Cardiovascular: Normal rate.   Pulmonary/Chest: Effort normal.  Abdominal: Soft. There is no tenderness.     CBC    Component Value Date/Time   WBC 8.0 07/28/2012 1310   RBC 4.51 07/28/2012 1310   HGB 14.2 07/28/2012 1310   HCT 41.3 07/28/2012 1310   PLT 250 07/28/2012 1310   MCV 91.6 07/28/2012 1310   MCH 31.5 07/28/2012 1310   MCHC 34.4 07/28/2012 1310   RDW 12.3 07/28/2012 1310   LYMPHSABS 1.5 05/29/2012 1403   MONOABS 0.6 05/29/2012 1403   EOSABS 0.0 05/29/2012 1403   BASOSABS 0.0 05/29/2012 1403         Assessment & Plan:

## 2012-08-09 NOTE — Assessment & Plan Note (Signed)
Much improved.  Advised no pregnancy x 3 mos.  Preconception counseling done.

## 2014-02-04 ENCOUNTER — Encounter: Payer: Self-pay | Admitting: Family Medicine

## 2014-04-05 DIAGNOSIS — O24419 Gestational diabetes mellitus in pregnancy, unspecified control: Secondary | ICD-10-CM

## 2014-04-05 HISTORY — DX: Gestational diabetes mellitus in pregnancy, unspecified control: O24.419

## 2014-05-13 ENCOUNTER — Ambulatory Visit: Payer: Self-pay | Attending: Internal Medicine

## 2014-05-23 ENCOUNTER — Ambulatory Visit: Payer: Self-pay | Attending: Internal Medicine | Admitting: Internal Medicine

## 2014-05-23 ENCOUNTER — Encounter: Payer: Self-pay | Admitting: Internal Medicine

## 2014-05-23 VITALS — BP 132/87 | HR 102 | Temp 98.2°F | Resp 16 | Ht 62.0 in | Wt 144.0 lb

## 2014-05-23 DIAGNOSIS — Z0189 Encounter for other specified special examinations: Secondary | ICD-10-CM | POA: Insufficient documentation

## 2014-05-23 DIAGNOSIS — Z0000 Encounter for general adult medical examination without abnormal findings: Secondary | ICD-10-CM

## 2014-05-23 NOTE — Patient Instructions (Signed)
Cold Sore  A cold sore (fever blister) is a skin infection caused by the herpes simplex virus (HSV-1). HSV-1 is closely related to the virus that causes genital herpes (HSV-2), but they are not the same even though both viruses can cause oral and genital infections. Cold sores are small, fluid-filled sores inside of the mouth or on the lips, gums, nose, chin, cheeks, or fingers.   The herpes simplex virus can be easily passed (contagious) to other people through close personal contact, such as kissing or sharing personal items. The virus can also spread to other parts of the body, such as the eyes or genitals. Cold sores are contagious until the sores crust over completely. They often heal within 2 weeks.   Once a person is infected, the herpes simplex virus remains permanently in the body. Therefore, there is no cure for cold sores, and they often recur when a person is tired, stressed, sick, or gets too much sun. Additional factors that can cause a recurrence include hormone changes in menstruation or pregnancy, certain drugs, and cold weather.   CAUSES   Cold sores are caused by the herpes simplex virus. The virus is spread from person to person through close contact, such as through kissing, touching the affected area, or sharing personal items such as lip balm, razors, or eating utensils.   SYMPTOMS   The first infection may not cause symptoms. If symptoms develop, the symptoms often go through different stages. Here is how a cold sore develops:   · Tingling, itching, or burning is felt 1-2 days before the outbreak.    · Fluid-filled blisters appear on the lips, inside the mouth, nose, or on the cheeks.    · The blisters start to ooze clear fluid.    · The blisters dry up and a yellow crust appears in its place.    · The crust falls off.    Symptoms depend on whether it is the initial outbreak or a recurrence. Some other symptoms with the first outbreak may include:   · Fever.    · Sore throat.    · Headache.     · Muscle aches.    · Swollen neck glands.    DIAGNOSIS   A diagnosis is often made based on your symptoms and looking at the sores. Sometimes, a sore may be swabbed and then examined in the lab to make a final diagnosis. If the sores are not present, blood tests can find the herpes simplex virus.   TREATMENT   There is no cure for cold sores and no vaccine for the herpes simplex virus. Within 2 weeks, most cold sores go away on their own without treatment. Medicines cannot make the infection go away, but medicine can help relieve some of the pain associated with the sores, can work to stop the virus from multiplying, and can also shorten healing time. Medicine may be in the form of creams, gels, pills, or a shot.   HOME CARE INSTRUCTIONS   · Only take over-the-counter or prescription medicines for pain, discomfort, or fever as directed by your caregiver. Do not use aspirin.    · Use a cotton-tip swab to apply creams or gels to your sores.    · Do not touch the sores or pick the scabs. Wash your hands often. Do not touch your eyes without washing your hands first.    · Avoid kissing, oral sex, and sharing personal items until sores heal.    · Apply an ice pack on your sores for 10-15 minutes to ease any discomfort.    ·   Avoid hot, cold, or salty foods because they may hurt your mouth. Eat a soft, bland diet to avoid irritating the sores. Use a straw to drink if you have pain when drinking out of a glass.    · Keep sores clean and dry to prevent an infection of other tissues.    · Avoid the sun and limit stress if these things trigger outbreaks. If sun causes cold sores, apply sunscreen on the lips before being out in the sun.    SEEK MEDICAL CARE IF:   · You have a fever or persistent symptoms for more than 2-3 days.    · You have a fever and your symptoms suddenly get worse.    · You have pus, not clear fluid, coming from the sores.    · You have redness that is spreading.    · You have pain or irritation in your  eye.    · You get sores on your genitals.    · Your sores do not heal within 2 weeks.    · You have a weakened immune system.    · You have frequent recurrences of cold sores.    MAKE SURE YOU:   · Understand these instructions.  · Will watch your condition.  · Will get help right away if you are not doing well or get worse.  Document Released: 03/19/2000 Document Revised: 08/06/2013 Document Reviewed: 08/04/2011  ExitCare® Patient Information ©2015 [employer]. This information is not intended to replace advice given to you by your health care provider. Make sure you discuss any questions you have with your health care provider.

## 2014-05-23 NOTE — Progress Notes (Signed)
Pt is here today to establish care. Pt has no C.C. Today but she dose want to check her cholesterol and her thyroid.

## 2014-05-23 NOTE — Progress Notes (Signed)
Patient ID: Tiffany [name], female   DOB: 1976-02-11, 39 y.o.   MRN: [id number]  [id number]:[id number]  [id number]  DOB - 09/18/1975  CC:  Chief Complaint  Patient presents with  . Establish Care       HPI: Tiffany [name] is a 39 y.o. female here today to establish medical care. Patient has no past medical history.  She presents to clinic today for concerns of blood work. She states that she went to the GYN last month and was told that she needed to have certain lab test to figure out why she has not conceived. She has two children now. Her LMP 1/26. Her cycles are around the same time monthly.    Patient has No headache, No chest pain, No abdominal pain - No Nausea, No new weakness tingling or numbness, No Cough - SOB.  No Known Allergies Past Medical History  Diagnosis Date  . Medical history non-contributory    Current Outpatient Prescriptions on File Prior to Visit  Medication Sig Dispense Refill  . calcium carbonate (TUMS - DOSED IN MG ELEMENTAL CALCIUM) 500 MG chewable tablet Chew 1 tablet by mouth daily.    Marland Kitchen doxycycline (VIBRAMYCIN) 100 MG capsule Take 1 capsule (100 mg total) by mouth 2 (two) times daily. (Patient not taking: Reported on 05/23/2014) 20 capsule 0  . ibuprofen (ADVIL,MOTRIN) 600 MG tablet Take 1 tablet (600 mg total) by mouth every 6 (six) hours as needed for pain. (Patient not taking: Reported on 05/23/2014) 30 tablet 1   No current facility-administered medications on file prior to visit.   Family History  Problem Relation Age of Onset  . Hypertension Mother   . Hyperlipidemia Mother   . Hypertension Maternal Grandmother   . Hyperlipidemia Maternal Grandmother    History   Social History  . Marital Status: Married    Spouse Name: N/A  . Number of Children: N/A  . Years of Education: N/A   Occupational History  . Not on file.   Social History Main Topics  . Smoking status: Never Smoker   . Smokeless tobacco: Never Used  . Alcohol Use: No  . Drug  Use: No  . Sexual Activity: Yes    Birth Control/ Protection: None   Other Topics Concern  . Not on file   Social History Narrative    Review of Systems: Constitutional: Negative for fever, chills, diaphoresis, activity change, appetite change and fatigue. HENT: Negative for ear pain, nosebleeds, congestion, facial swelling, rhinorrhea, neck pain, neck stiffness and ear discharge.  Eyes: Negative for pain, discharge, redness, itching and visual disturbance. Respiratory: Negative for cough, choking, chest tightness, shortness of breath, wheezing and stridor.  Cardiovascular: Negative for chest pain, palpitations and leg swelling. Gastrointestinal: Negative for abdominal distention. Genitourinary: Negative for dysuria, urgency, frequency, hematuria, flank pain, decreased urine volume, difficulty urinating and dyspareunia.  Musculoskeletal: Negative for back pain, joint swelling, arthralgia and gait problem. Neurological: Negative for dizziness, tremors, seizures, syncope, facial asymmetry, speech difficulty, weakness, light-headedness, numbness and headaches.  Hematological: Negative for adenopathy. Does not bruise/bleed easily. Psychiatric/Behavioral: Negative for hallucinations, behavioral problems, confusion, dysphoric mood, decreased concentration and agitation.    Objective:   Filed Vitals:   05/23/14 1543  BP: 132/87  Pulse: 102  Temp: 98.2 F (36.8 C)  Resp: 16    Physical Exam: Constitutional: Patient appears well-developed and well-nourished. No distress.. Neck: Normal ROM. Neck supple. No JVD. No tracheal deviation. No thyromegaly. CVS: RRR, S1/S2 +, no murmurs, no gallops, no carotid  bruit.  Pulmonary: Effort and breath sounds normal, no stridor, rhonchi, wheezes, rales.  Abdominal: Soft. BS +, no distension, tenderness, rebound or guarding.  Musculoskeletal: Normal range of motion. No edema and no tenderness.  Lymphadenopathy: No lymphadenopathy noted,  cervical Neuro: Alert. Normal reflexes, muscle tone coordination. No cranial nerve deficit. Skin: Skin is warm and dry. No rash noted. Not diaphoretic. No erythema. No pallor. Psychiatric: Normal mood and affect. Behavior, judgment, thought content normal.  Lab Results  Component Value Date   WBC 8.0 07/28/2012   HGB 14.2 07/28/2012   HCT 41.3 07/28/2012   MCV 91.6 07/28/2012   PLT 250 07/28/2012   No results found for: CREATININE, BUN, NA, K, CL, CO2  No results found for: HGBA1C Lipid Panel  No results found for: CHOL, TRIG, HDL, CHOLHDL, VLDL, LDLCALC     Assessment and plan:   Doree FudgeLuz was seen today for establish care.  Diagnoses and all orders for this visit:  Wellness examination Orders: -     TSH; Future -     T4, Free; Future -     Progesterone; Future -     Lipid panel; Future -     CBC; Future -     COMPLETE METABOLIC PANEL WITH GFR; Future Labs per GYN, will send results to patients GYN.  Return in about 1 day (around 05/24/2014) for Lab Visit.   Holland CommonsKECK, VALERIE, NP-C [facility] [phone] 05/23/2014, 4:01 PM

## 2014-05-24 ENCOUNTER — Ambulatory Visit: Payer: Self-pay | Attending: Internal Medicine

## 2014-05-24 DIAGNOSIS — N979 Female infertility, unspecified: Secondary | ICD-10-CM

## 2014-05-29 LAB — ANTI MULLERIAN HORMONE: AMH AssessR: 5.16 ng/mL

## 2014-05-30 ENCOUNTER — Telehealth: Payer: Self-pay | Admitting: *Deleted

## 2014-05-30 NOTE — Telephone Encounter (Signed)
-----   Message from Ambrose FinlandValerie A Keck, NP sent at 05/29/2014  8:14 PM EST ----- Patient has a normal egg count. Please print copy for patient to have results. Also find out her doctors name and fax number and send them a copy via fax. Thanks

## 2014-05-30 NOTE — Telephone Encounter (Signed)
Pt aware of results Will call tomorrow with Dr and Valinda HoarFax information

## 2014-05-31 NOTE — Telephone Encounter (Signed)
[facility]  Att Dr R. Tavon  Fax [phone]  Results Faxed

## 2014-07-18 IMAGING — US US OB COMP LESS 14 WK
1 series · 13 of 19 positions shown · non-contrast
Comparison: none

[Series 1: us ob detail +14 wk · 13 of 19 slices shown]
[im 1/19]
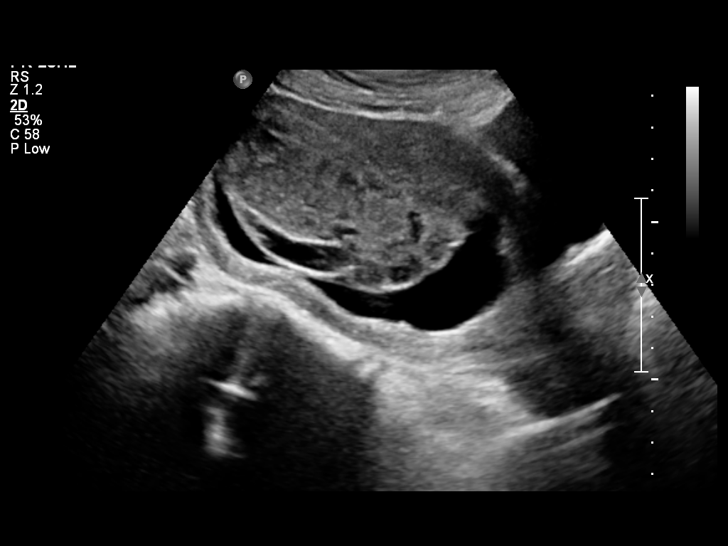
[im 3/19]
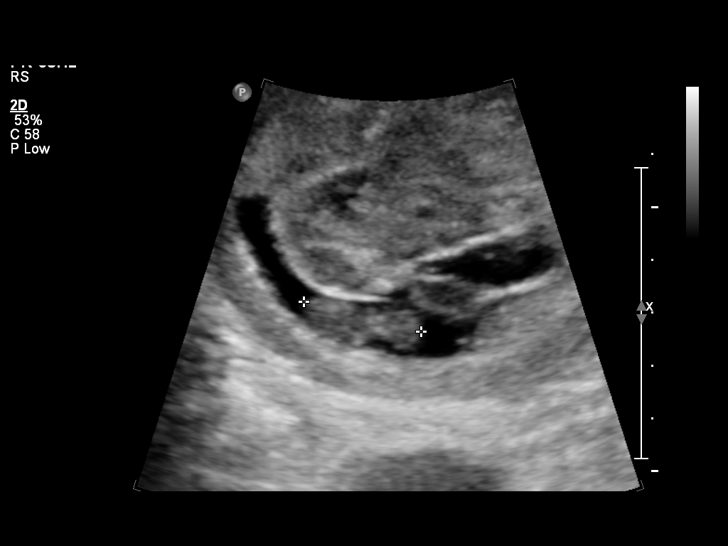
[im 4/19]
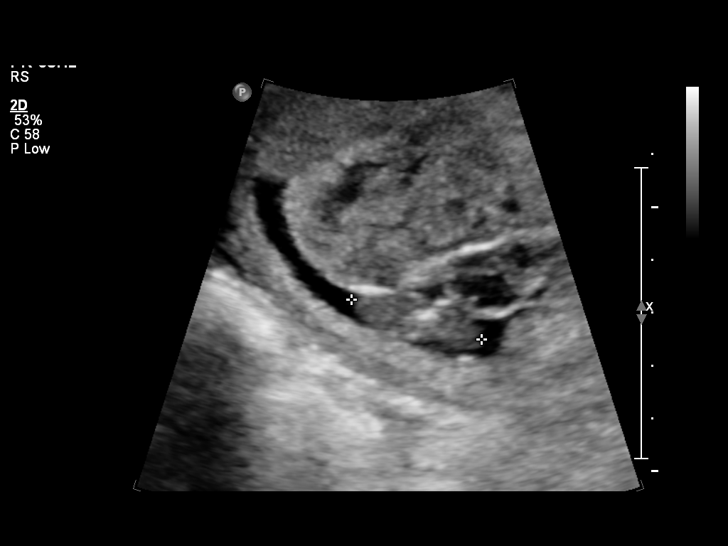
[im 6/19]
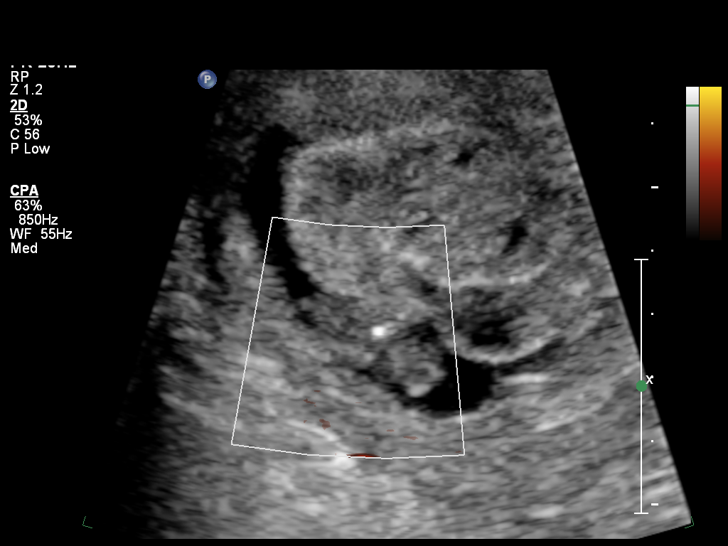
[im 7/19]
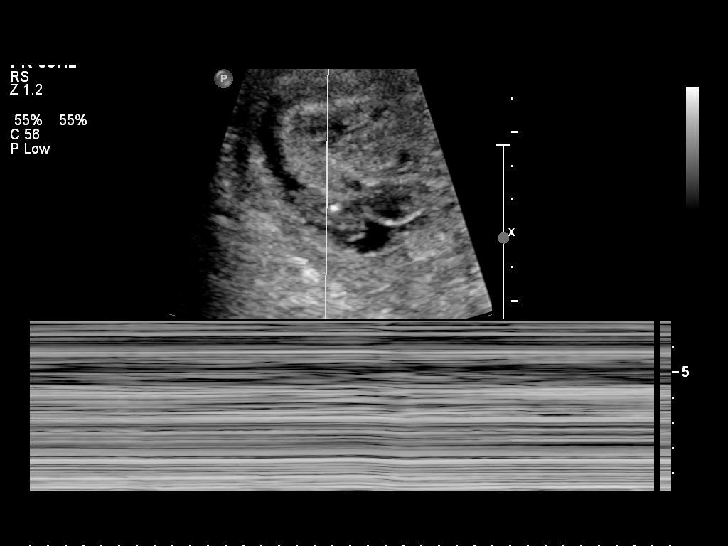
[im 9/19]
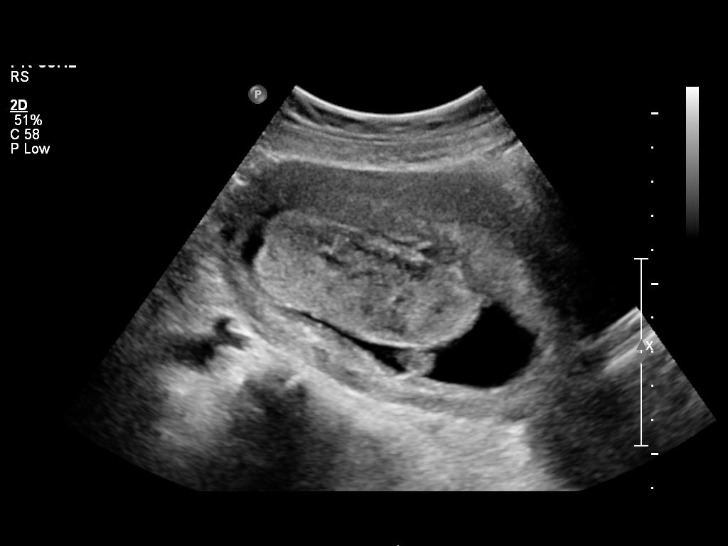
[im 10/19]
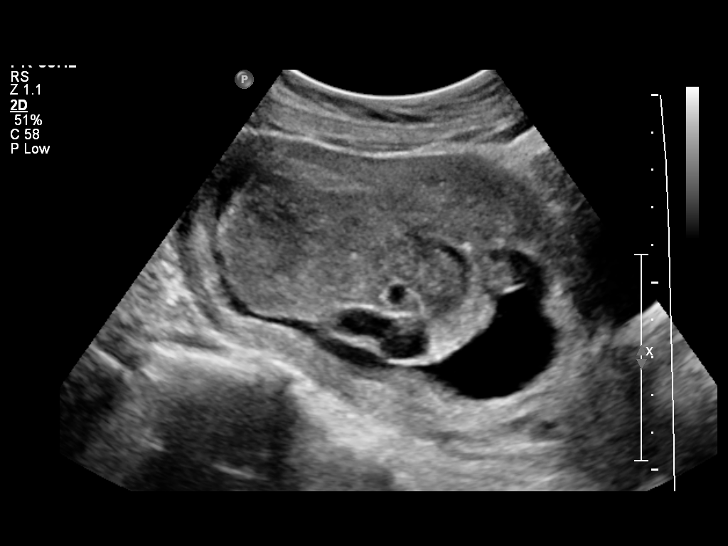
[im 11/19]
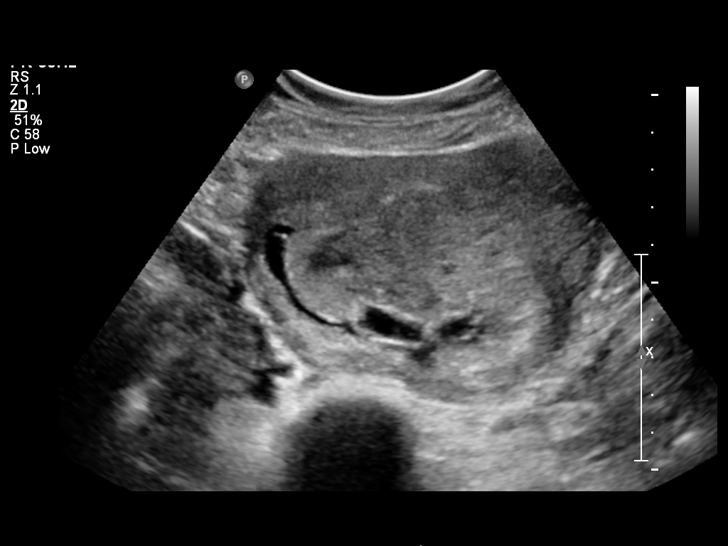
[im 13/19]
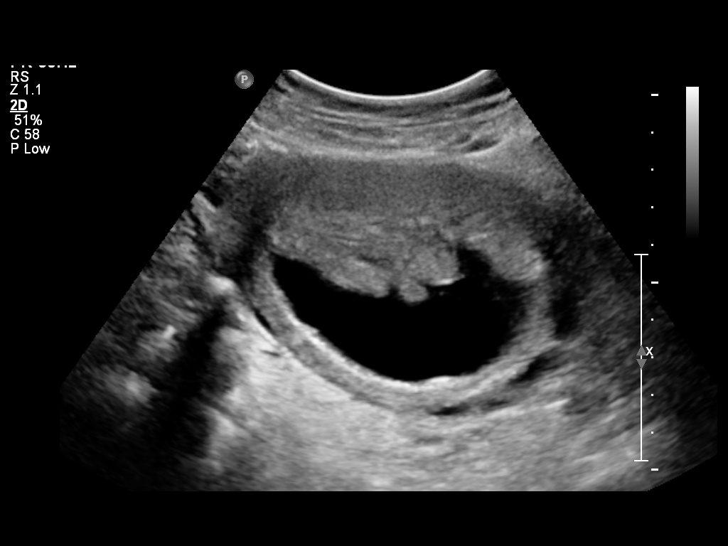
[im 14/19]
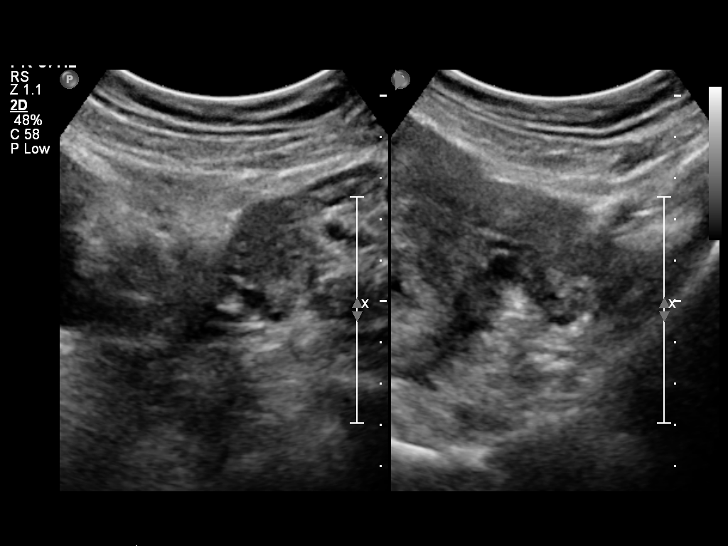
[im 16/19]
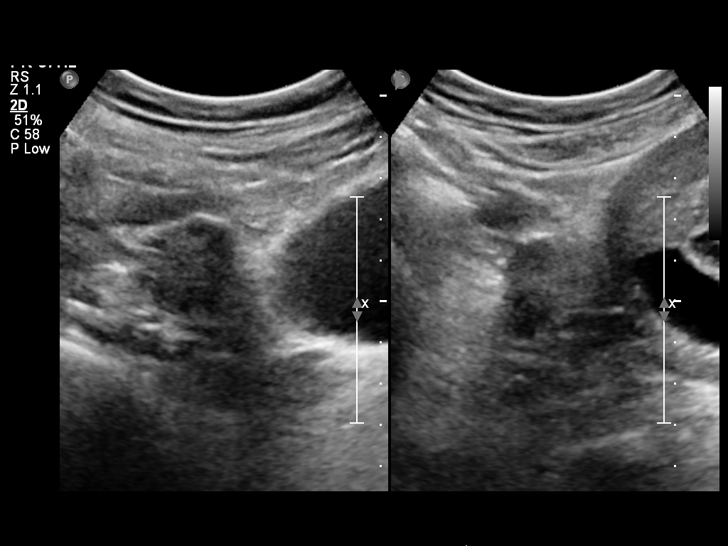
[im 17/19]
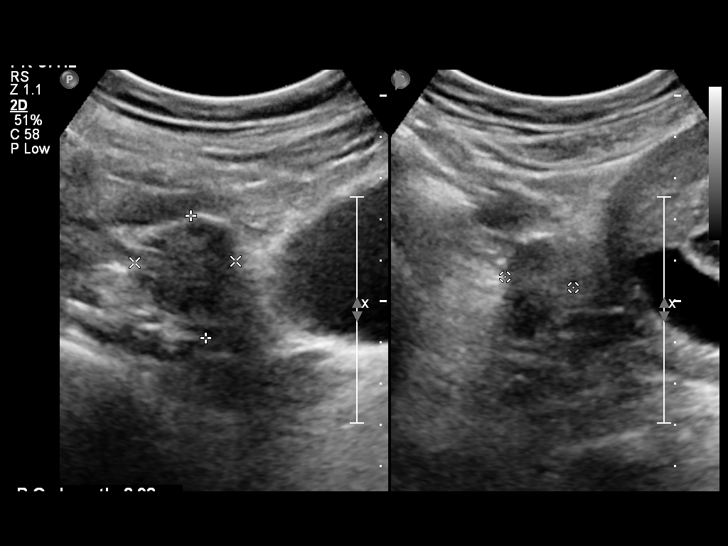
[im 19/19]
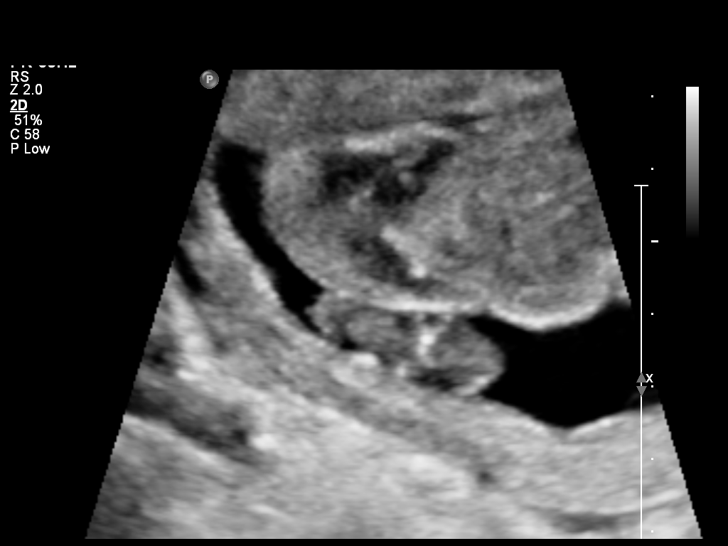

[13 of 19 positions shown; findings below may reference images not displayed]

OBSTETRICS REPORT
                      (Signed Final 06/21/2012 [DATE])

Service(s) Provided

 US OB COMP LESS 14 WKS                                76801.0
Indications

 Vaginal bleeding, unknown etiology
 Establish Gestational [AGE]
Fetal Evaluation

 Num Of Fetuses:    1
 Preg. Location:    Intrauterine
 Gest. Sac:         Intrauterine
 Yolk Sac:          Not visualized
 Fetal Pole:        Visualized
 Cardiac Activity:  Absent
 Placenta:          Anterior, above cervical os
Biometry

 CRL:     24.9  mm    G. Age:   9w 1d                  EDD:   01/23/13
Gestational Age

 LMP:           16w 2d       Date:   02/28/12                 EDD:   12/04/12
 Best:          9w 1d     Det. By:   U/S C R L (06/21/12)     EDD:   01/23/13
Cervix Uterus Adnexa

 Cervix:       Normal appearance by transabdominal scan.

 Left Ovary:   Size(cm) L: 3.08 x W: 2.21 x H: 2.16  Volume(cc):
 Right Ovary:  Size(cm) L: 2.98 x W: 1.67 x H: 2.44  Volume(cc):
 Adnexa:     No abnormality visualized.
Impression

 Findings meet definitive criteria for failed pregnancy.  (Ref:
 SRU consensus guidelines:  Diagnostic Criteria for Nonviable
 Pregnancy Early in the First Trimester. N Engl J Med 3775:
 [DATE].)

## 2014-08-22 IMAGING — US US TRANSVAGINAL NON-OB
1 series · 14 of 25 positions shown · non-contrast
Comparison: 06/21/2012

CLINICAL DATA: Persistent vaginal bleeding pelvic pain.  Recent
Cytotec for missed abortion.  Evaluate for retained products.

TRANSVAGINAL ULTRASOUND OF PELVIS
TECHNIQUE: Transvaginal ultrasound examination of the pelvis was
performed including evaluation of the uterus, ovaries, adnexal
regions, and pelvic cul-de-sac.

[Series 1: us transvaginal non-ob · 30 acquisitions, 14 frames shown]
[im 1/30]
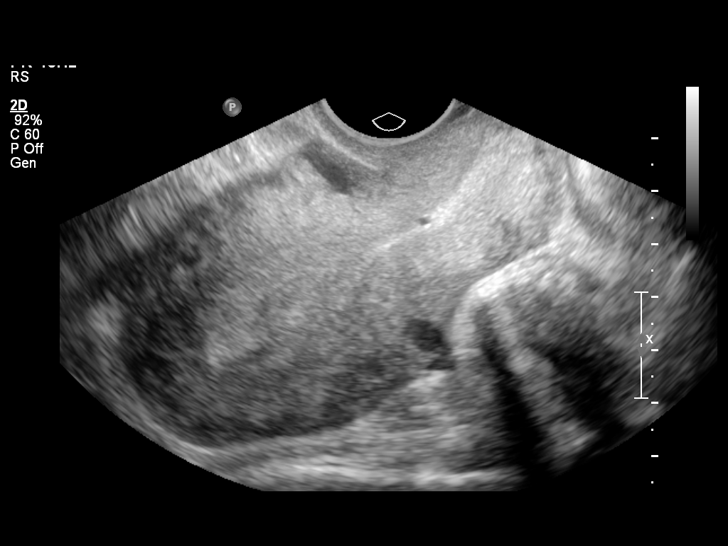
[im 3/30]
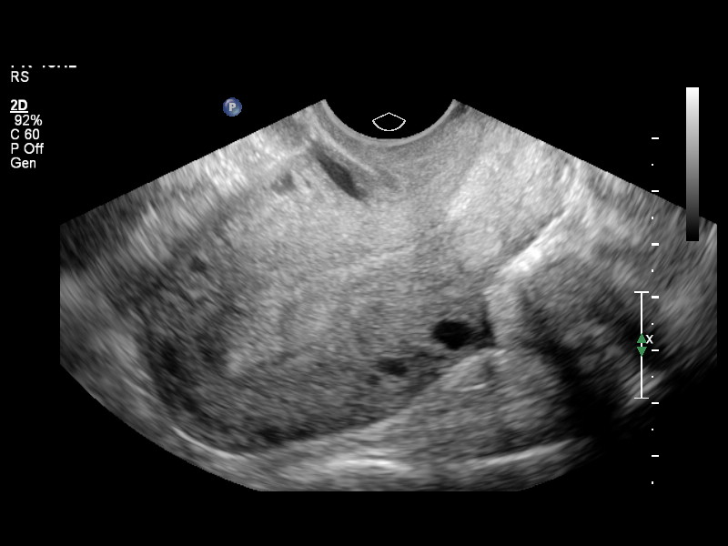
[im 5/30]
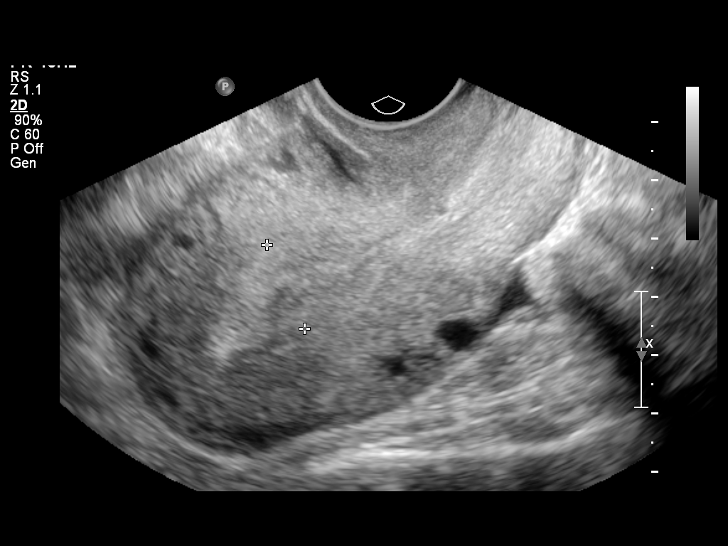
[im 8/30]
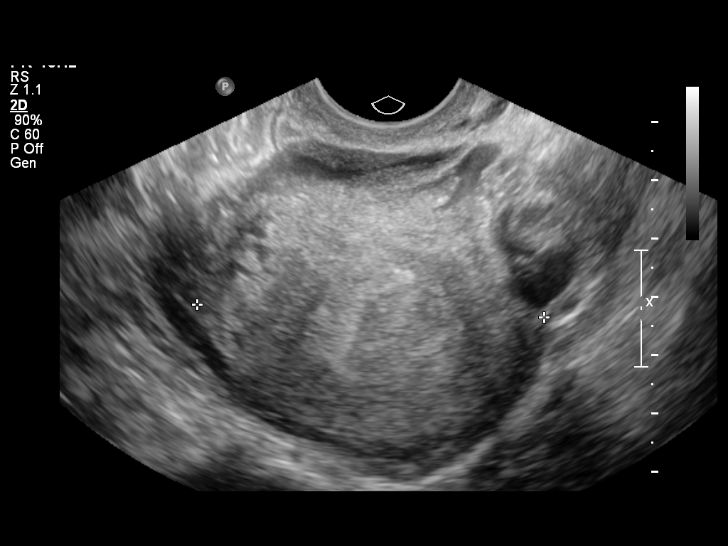
[im 10/30]
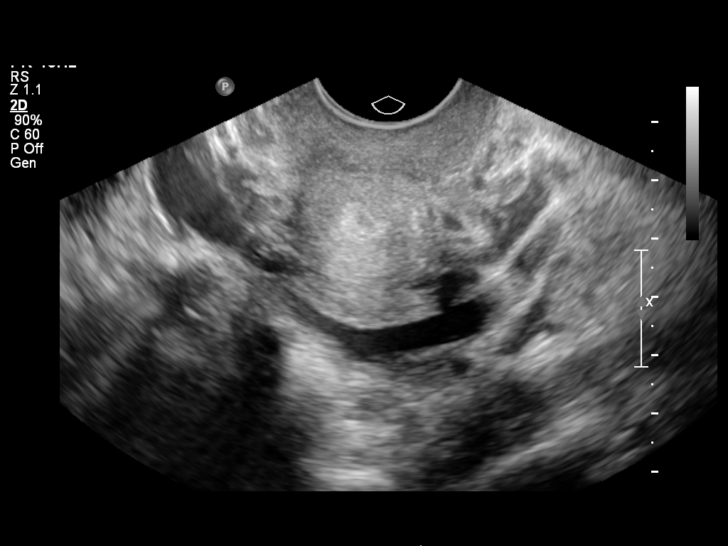
[im 11/30]
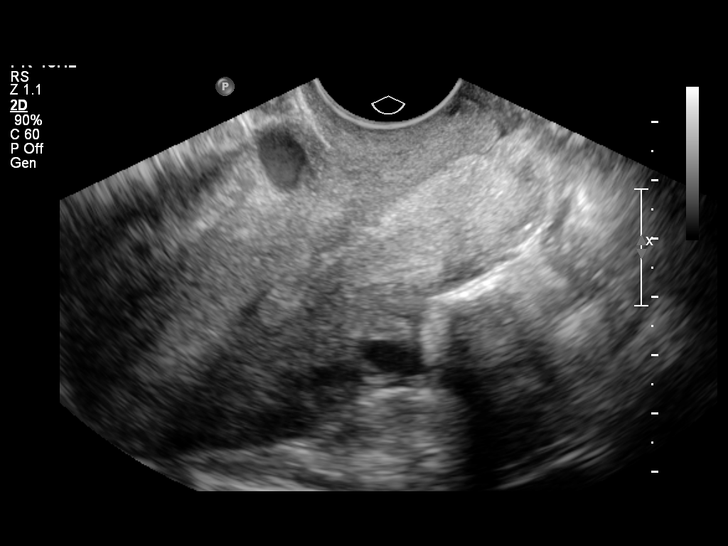
[im 14/30]
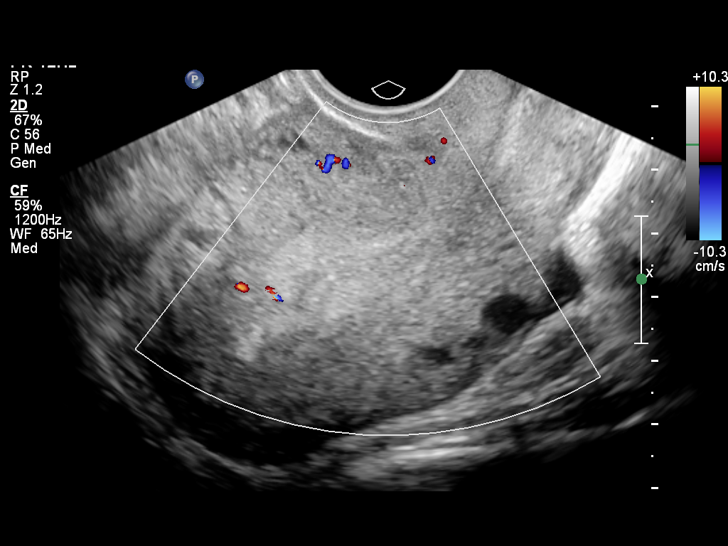
[im 16/30]
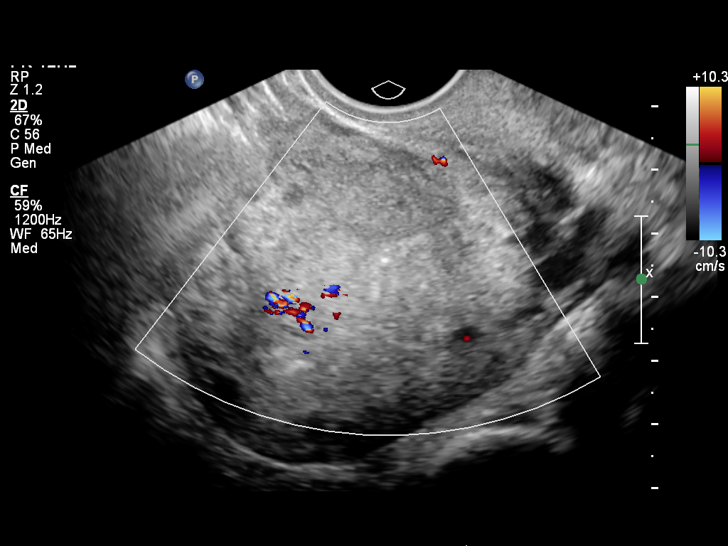
[im 19/30]
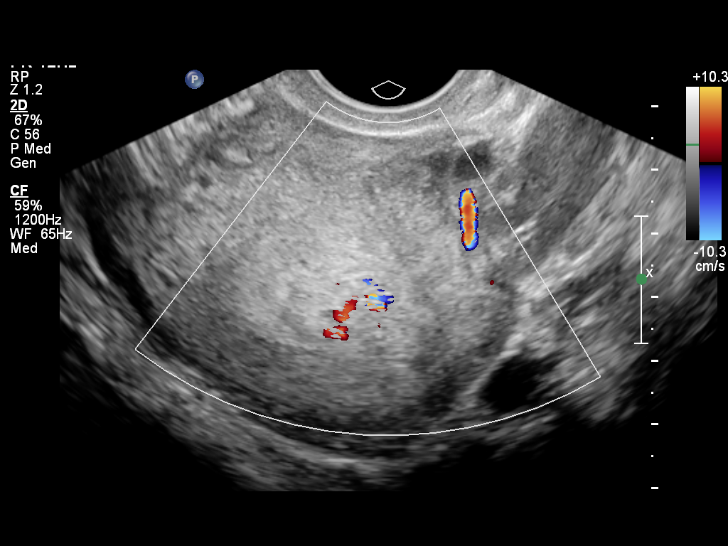
[im 20/30]
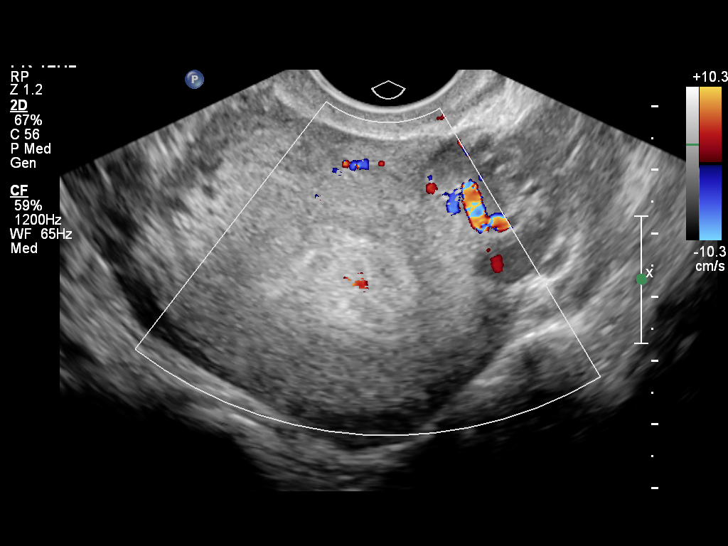
[im 22/30]
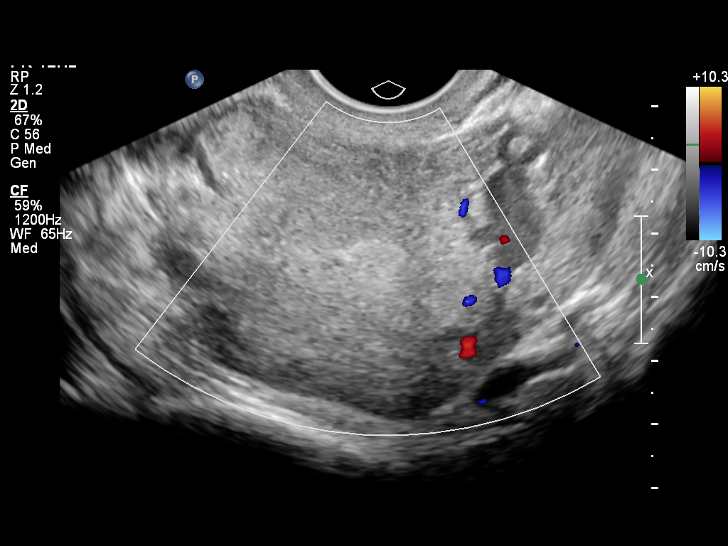
[im 25/30]
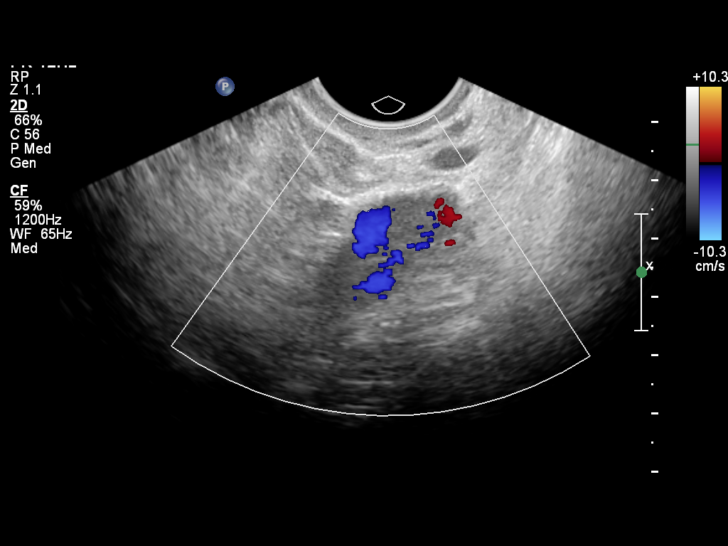
[im 27/30]
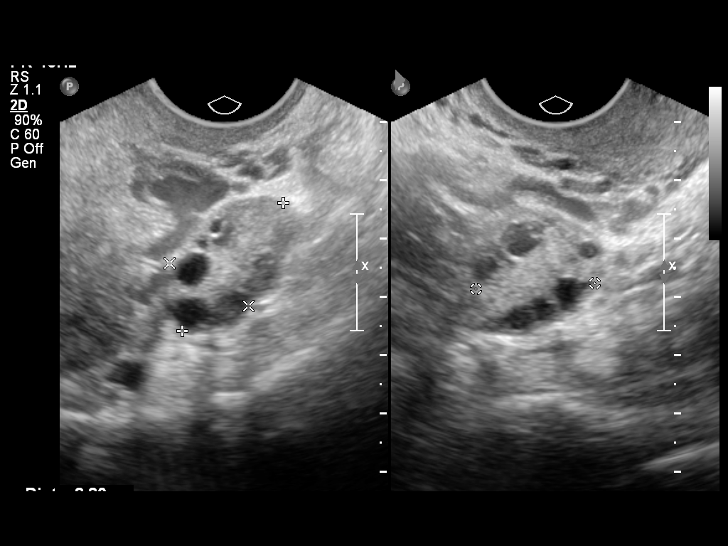
[im 30/30]
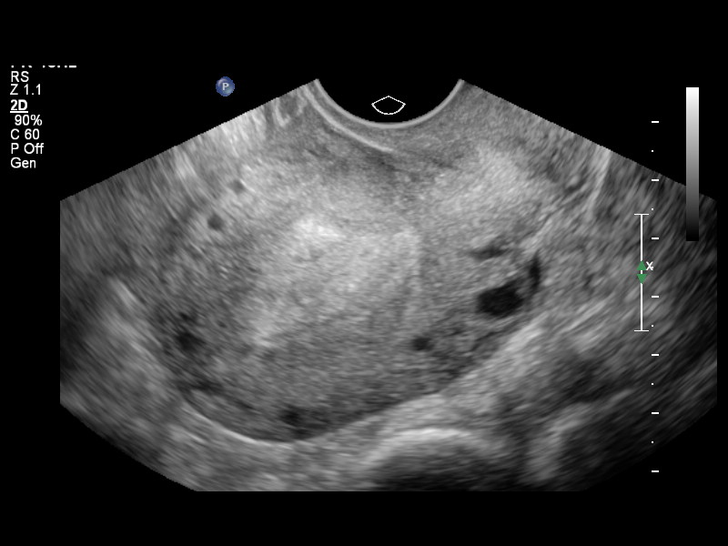

[14 of 25 positions shown; findings below may reference images not displayed]

FINDINGS: Uterus:     8.9 x 4.8 x 6.0 cm.  No fibroids identified.

Endometrium:      Thickened and inhomogeneous endometrial stripe is
seen which measures up to 1.8 cm transvaginally.  Increased
vascularity is also seen in the fundal portion of the endometrial
cavity on color Dopplerultrasound.  These findings are suggestive
of retained products of conception.

Right Ovary: 3.6 x 2.3 x 2.0 cm.  Normal appearance.

Left Ovary: 2.8 x 1.5 x 2.0 cm.  Normal appearance.

Other Findings:  No free fluid.
IMPRESSION: 1.  Thickened endometrium with internal vascularity in the fundal
region, suspicious for retained products of conception.
2.  No fibroids or adnexal mass identified.

## 2015-07-19 ENCOUNTER — Encounter (HOSPITAL_COMMUNITY): Payer: Self-pay | Admitting: *Deleted

## 2015-07-19 ENCOUNTER — Ambulatory Visit (HOSPITAL_COMMUNITY)
Admission: EM | Admit: 2015-07-19 | Discharge: 2015-07-19 | Disposition: A | Discharge status: Home / Self Care | Payer: Self-pay | Attending: Emergency Medicine | Admitting: Emergency Medicine

## 2015-07-19 DIAGNOSIS — J069 Acute upper respiratory infection, unspecified: Secondary | ICD-10-CM

## 2015-07-19 MED ORDER — BENZONATATE 100 MG PO CAPS: 100.0000 mg | ORAL_CAPSULE | Freq: Three times a day (TID) | ORAL | Status: DC

## 2015-07-19 NOTE — ED Provider Notes (Signed)
CSN: [id number]     Arrival date & time 07/19/15  1840 History   First MD Initiated Contact with Patient 07/19/15 1945     Chief Complaint  Patient presents with  . URI   (Consider location/radiation/quality/duration/timing/severity/associated sxs/prior Treatment) HPI History obtained from patient: Several day history of dry nonproductive cough.  OTC meds not helping  no fever.  .  Past Medical History  Diagnosis Date  . Medical history non-contributory    Past Surgical History  Procedure Laterality Date  . Breast surgery      implants  . Dilation and evacuation N/A 07/28/2012    Procedure: DILATATION AND EVACUATION;  Surgeon: Reva Boresanya S Pratt, MD;  Location: [facility];  Service: Gynecology;  Laterality: N/A;   Family History  Problem Relation Age of Onset  . Hypertension Mother   . Hyperlipidemia Mother   . Hypertension Maternal Grandmother   . Hyperlipidemia Maternal Grandmother    Social History  Substance Use Topics  . Smoking status: Never Smoker   . Smokeless tobacco: Never Used  . Alcohol Use: No   OB History    Gravida Para Term Preterm AB TAB SAB Ectopic Multiple Living   4 2 2  0 1 0 1 0 0 2     Review of Systems cough Allergies  Review of patient's allergies indicates no known allergies.  Home Medications   Prior to Admission medications   Medication Sig Start Date End Date Taking? Authorizing Provider  benzonatate (TESSALON) 100 MG capsule Take 1 capsule (100 mg total) by mouth every 8 (eight) hours. 07/19/15   Tharon AquasFrank C Patrick, PA  calcium carbonate (TUMS - DOSED IN MG ELEMENTAL CALCIUM) 500 MG chewable tablet Chew 1 tablet by mouth daily.    Historical Provider, MD  doxycycline (VIBRAMYCIN) 100 MG capsule Take 1 capsule (100 mg total) by mouth 2 (two) times daily. Patient not taking: Reported on 05/23/2014 07/28/12   Reva Boresanya S Pratt, MD  ibuprofen (ADVIL,MOTRIN) 600 MG tablet Take 1 tablet (600 mg total) by mouth every 6 (six) hours as needed for  pain. Patient not taking: Reported on 05/23/2014 06/21/12   Deirdre Colin Mulders Poe, CNM   Meds Ordered and Administered this Visit  Medications - No data to display  BP 125/87 mmHg  Pulse 103  Temp(Src) 98.7 F (37.1 C) (Oral)  Resp 18  SpO2 100%  LMP 06/26/2015 No data found.   Physical Exam NURSES NOTES AND VITAL SIGNS REVIEWED. CONSTITUTIONAL: Well developed, well nourished, no acute distress HEENT: normocephalic, atraumatic, right and left TM's are normal EYES: Conjunctiva normal NECK:normal ROM, supple, no adenopathy PULMONARY:No respiratory distress, normal effort, Lungs: CTAb/l, no wheezes, or increased work of breathing CARDIOVASCULAR: RRR, no murmur ABDOMEN: soft, ND, NT, +'ve BS MUSCULOSKELETAL: Normal ROM of all extremities,  SKIN: warm and dry without rash PSYCHIATRIC: Mood and affect, behavior are normal   ED Course  Procedures (including critical care time)  Labs Review Labs Reviewed - No data to display  Imaging Review No results found.   Visual Acuity Review  Right Eye Distance:   Left Eye Distance:   Bilateral Distance:    Right Eye Near:   Left Eye Near:    Bilateral Near:      rx tessalon   MDM   1. Acute URI     Patient is reassured that there are no issues that require transfer to higher level of care at this time or additional tests. Patient is advised to continue home symptomatic treatment. Patient  is advised that if there are new or worsening symptoms to attend the emergency department, contact primary care provider, or return to UC. Instructions of care provided discharged home in stable condition.    THIS NOTE WAS GENERATED USING A VOICE RECOGNITION SOFTWARE PROGRAM. ALL REASONABLE EFFORTS  WERE MADE TO PROOFREAD THIS DOCUMENT FOR ACCURACY.  I have verbally reviewed the discharge instructions with the patient. A printed AVS was given to the patient.  All questions were answered prior to discharge.      Tharon Aquas, [place] 07/19/15  2033

## 2015-07-19 NOTE — ED Notes (Signed)
Pt reports  Symptoms  Of  Cough  /  Congested  Cold  Symptoms      With  Hoarseness    surethroat      And  Non productive  Cough             symptoms  unreleived  By  otc  meds

## 2015-07-19 NOTE — Discharge Instructions (Signed)
Upper Respiratory Infection, Adult °Most upper respiratory infections (URIs) are a viral infection of the air passages leading to the lungs. A URI affects the nose, throat, and upper air passages. The most common type of URI is nasopharyngitis and is typically referred to as "the common cold." °URIs run their course and usually go away on their own. Most of the time, a URI does not require medical attention, but sometimes a bacterial infection in the upper airways can follow a viral infection. This is called a secondary infection. Sinus and middle ear infections are common types of secondary upper respiratory infections. °Bacterial pneumonia can also complicate a URI. A URI can worsen asthma and chronic obstructive pulmonary disease (COPD). Sometimes, these complications can require emergency medical care and may be life threatening.  °CAUSES °Almost all URIs are caused by viruses. A virus is a type of germ and can spread from one person to another.  °RISKS FACTORS °You may be at risk for a URI if:  °· You smoke.   °· You have chronic heart or lung disease. °· You have a weakened defense (immune) system.   °· You are very young or very old.   °· You have nasal allergies or asthma. °· You work in crowded or poorly ventilated areas. °· You work in health care facilities or schools. °SIGNS AND SYMPTOMS  °Symptoms typically develop 2-3 days after you come in contact with a cold virus. Most viral URIs last 7-10 days. However, viral URIs from the influenza virus (flu virus) can last 14-18 days and are typically more severe. Symptoms may include:  °· Runny or stuffy (congested) nose.   °· Sneezing.   °· Cough.   °· Sore throat.   °· Headache.   °· Fatigue.   °· Fever.   °· Loss of appetite.   °· Pain in your forehead, behind your eyes, and over your cheekbones (sinus pain). °· Muscle aches.   °DIAGNOSIS  °Your health care provider may diagnose a URI by: °· Physical exam. °· Tests to check that your symptoms are not due to  another condition such as: °· Strep throat. °· Sinusitis. °· Pneumonia. °· Asthma. °TREATMENT  °A URI goes away on its own with time. It cannot be cured with medicines, but medicines may be prescribed or recommended to relieve symptoms. Medicines may help: °· Reduce your fever. °· Reduce your cough. °· Relieve nasal congestion. °HOME CARE INSTRUCTIONS  °· Take medicines only as directed by your health care provider.   °· Gargle warm saltwater or take cough drops to comfort your throat as directed by your health care provider. °· Use a warm mist humidifier or inhale steam from a shower to increase air moisture. This may make it easier to breathe. °· Drink enough fluid to keep your urine clear or pale yellow.   °· Eat soups and other clear broths and maintain good nutrition.   °· Rest as needed.   °· Return to work when your temperature has returned to normal or as your health care provider advises. You may need to stay home longer to avoid infecting others. You can also use a face mask and careful hand washing to prevent spread of the virus. °· Increase the usage of your inhaler if you have asthma.   °· Do not use any tobacco products, including cigarettes, chewing tobacco, or electronic cigarettes. If you need help quitting, ask your health care provider. °PREVENTION  °The best way to protect yourself from getting a cold is to practice good hygiene.  °· Avoid oral or hand contact with people with cold   symptoms.   °· Wash your hands often if contact occurs.   °There is no clear evidence that vitamin C, vitamin E, echinacea, or exercise reduces the chance of developing a cold. However, it is always recommended to get plenty of rest, exercise, and practice good nutrition.  °SEEK MEDICAL CARE IF:  °· You are getting worse rather than better.   °· Your symptoms are not controlled by medicine.   °· You have chills. °· You have worsening shortness of breath. °· You have brown or red mucus. °· You have yellow or brown nasal  discharge. °· You have pain in your face, especially when you bend forward. °· You have a fever. °· You have swollen neck glands. °· You have pain while swallowing. °· You have white areas in the back of your throat. °SEEK IMMEDIATE MEDICAL CARE IF:  °· You have severe or persistent: °¨ Headache. °¨ Ear pain. °¨ Sinus pain. °¨ Chest pain. °· You have chronic lung disease and any of the following: °¨ Wheezing. °¨ Prolonged cough. °¨ Coughing up blood. °¨ A change in your usual mucus. °· You have a stiff neck. °· You have changes in your: °¨ Vision. °¨ Hearing. °¨ Thinking. °¨ Mood. °MAKE SURE YOU:  °· Understand these instructions. °· Will watch your condition. °· Will get help right away if you are not doing well or get worse. °  °This information is not intended to replace advice given to you by your health care provider. Make sure you discuss any questions you have with your health care provider. °  °Document Released: 09/15/2000 Document Revised: 08/06/2014 Document Reviewed: 06/27/2013 °Elsevier Interactive Patient Education ©2016 [employer] ° °Cough, Adult °A cough helps to clear your throat and lungs. A cough may last only 2-3 weeks (acute), or it may last longer than 8 weeks (chronic). Many different things can cause a cough. A cough may be a sign of an illness or another medical condition. °HOME CARE °· Pay attention to any changes in your cough. °· Take medicines only as told by your doctor. °¨ If you were prescribed an antibiotic medicine, take it as told by your doctor. Do not stop taking it even if you start to feel better. °¨ Talk with your doctor before you try using a cough medicine. °· Drink enough fluid to keep your pee (urine) clear or pale yellow. °· If the air is dry, use a cold steam vaporizer or humidifier in your home. °· Stay away from things that make you cough at work or at home. °· If your cough is worse at night, try using extra pillows to raise your head up higher while you  sleep. °· Do not smoke, and try not to be around smoke. If you need help quitting, ask your doctor. °· Do not have caffeine. °· Do not drink alcohol. °· Rest as needed. °GET HELP IF: °· You have new problems (symptoms). °· You cough up yellow fluid (pus). °· Your cough does not get better after 2-3 weeks, or your cough gets worse. °· Medicine does not help your cough and you are not sleeping well. °· You have pain that gets worse or pain that is not helped with medicine. °· You have a fever. °· You are losing weight and you do not know why. °· You have night sweats. °GET HELP RIGHT AWAY IF: °· You cough up blood. °· You have trouble breathing. °· Your heartbeat is very fast. °  °This information is not intended to replace   advice given to you by your health care provider. Make sure you discuss any questions you have with your health care provider. °  °Document Released: 12/03/2010 Document Revised: 12/11/2014 Document Reviewed: 05/29/2014 °Elsevier Interactive Patient Education ©2016 Elsevier Inc. ° °

## 2015-08-22 LAB — GLUCOSE, POCT (MANUAL RESULT ENTRY): POC Glucose: 93 mg/dl (ref 70–99)

## 2016-11-27 LAB — GLUCOSE, POCT (MANUAL RESULT ENTRY): POC Glucose: 83 mg/dl (ref 70–99)

## 2017-11-23 ENCOUNTER — Emergency Department (HOSPITAL_COMMUNITY)
Admission: EM | Admit: 2017-11-23 | Discharge: 2017-11-23 | Disposition: A | Discharge status: Home / Self Care | Payer: Self-pay | Attending: Emergency Medicine | Admitting: Emergency Medicine

## 2017-11-23 ENCOUNTER — Encounter (HOSPITAL_COMMUNITY): Payer: Self-pay

## 2017-11-23 DIAGNOSIS — Z79899 Other long term (current) drug therapy: Secondary | ICD-10-CM | POA: Insufficient documentation

## 2017-11-23 DIAGNOSIS — R197 Diarrhea, unspecified: Secondary | ICD-10-CM | POA: Insufficient documentation

## 2017-11-23 DIAGNOSIS — E860 Dehydration: Secondary | ICD-10-CM | POA: Insufficient documentation

## 2017-11-23 DIAGNOSIS — R55 Syncope and collapse: Secondary | ICD-10-CM | POA: Insufficient documentation

## 2017-11-23 LAB — COMPREHENSIVE METABOLIC PANEL
ALT: 12 U/L (ref 0–44)
AST: 26 U/L (ref 15–41)
Albumin: 3.8 g/dL (ref 3.5–5.0)
Alkaline Phosphatase: 89 U/L (ref 38–126)
Anion gap: 6 (ref 5–15)
BUN: 12 mg/dL (ref 6–20)
CO2: 24 mmol/L (ref 22–32)
Calcium: 9.2 mg/dL (ref 8.9–10.3)
Chloride: 111 mmol/L (ref 98–111)
Creatinine, Ser: 0.75 mg/dL (ref 0.44–1.00)
GFR calc Af Amer: >60 mL/min (ref >60)
GFR calc non Af Amer: >60 mL/min (ref >60)
Glucose, Bld: 93 mg/dL (ref 70–99)
Potassium: 4.6 mmol/L (ref 3.5–5.1)
Sodium: 141 mmol/L (ref 135–145)
Total Bilirubin: 1.1 mg/dL (ref 0.3–1.2)
Total Protein: 6.8 g/dL (ref 6.5–8.1)

## 2017-11-23 LAB — CBC WITH DIFFERENTIAL/PLATELET
Abs Immature Granulocytes: 0 10*3/uL (ref 0.0–0.1)
Basophils Absolute: 0 10*3/uL (ref 0.0–0.1)
Basophils Relative: 0 %
Eosinophils Absolute: 0 10*3/uL (ref 0.0–0.7)
Eosinophils Relative: 1 %
HCT: 40.5 % (ref 36.0–46.0)
Hemoglobin: 13.4 g/dL (ref 12.0–15.0)
Immature Granulocytes: 0 %
Lymphocytes Relative: 27 %
Lymphs Abs: 1.3 10*3/uL (ref 0.7–4.0)
MCH: 30.3 pg (ref 26.0–34.0)
MCHC: 33.1 g/dL (ref 30.0–36.0)
MCV: 91.6 fL (ref 78.0–100.0)
Monocytes Absolute: 0.4 10*3/uL (ref 0.1–1.0)
Monocytes Relative: 8 %
Neutro Abs: 3.1 10*3/uL (ref 1.7–7.7)
Neutrophils Relative %: 64 %
Platelets: 241 10*3/uL (ref 150–400)
RBC: 4.42 MIL/uL (ref 3.87–5.11)
RDW: 12.9 % (ref 11.5–15.5)
WBC: 4.9 10*3/uL (ref 4.0–10.5)

## 2017-11-23 LAB — URINALYSIS, ROUTINE W REFLEX MICROSCOPIC
Bilirubin Urine: NEGATIVE
Glucose, UA: NEGATIVE mg/dL
Ketones, ur: NEGATIVE mg/dL
Leukocytes, UA: NEGATIVE
Nitrite: NEGATIVE
Protein, ur: NEGATIVE mg/dL
Specific Gravity, Urine: 1.005 (ref 1.005–1.030)
pH: 6 (ref 5.0–8.0)

## 2017-11-23 LAB — I-STAT TROPONIN, ED: Troponin i, poc: 0.01 ng/mL (ref 0.00–0.08)

## 2017-11-23 LAB — I-STAT BETA HCG BLOOD, ED (MC, WL, AP ONLY): I-stat hCG, quantitative: <5 m[IU]/mL (ref <5)

## 2017-11-23 LAB — LIPASE, BLOOD: Lipase: 30 U/L (ref 11–51)

## 2017-11-23 MED ORDER — SODIUM CHLORIDE 0.9 % IV BOLUS
1000.0000 mL | Freq: Once | INTRAVENOUS | Status: AC
  Administered 2017-11-23: 1000 mL via INTRAVENOUS

## 2017-11-23 NOTE — ED Triage Notes (Signed)
Patient arrives via EMS from home with c/o diarrhea x 1 day; patient attempted to go to the bathroom this morning and had near syncope episode; No prior hx noted; Patient a&ox 4 on arrival.-Monique,RN

## 2017-11-23 NOTE — ED Notes (Signed)
ED Provider at bedside. 

## 2017-11-23 NOTE — Discharge Instructions (Addendum)
Your lab work and EKG today is reassuring.  This episode could have come on from dehydration please make sure you are drinking plenty of water and eating well.  Please follow-[facility] with [facility].  Return to the emergency department for repeat syncopal episodes, abdominal pain, persistent vomiting, severe headache, chest pain, shortness of breath or any other new or concerning symptoms.

## 2017-11-23 NOTE — ED Provider Notes (Signed)
[facility] Provider Note   CSN: [id number] Arrival date & time: 11/23/17  [id number]     History   Chief Complaint Chief Complaint  Patient presents with  . Diarrhea  . Near Syncope    HPI Ardelle BallsLuz M Ramirez-May is a 42 y.o. female.  Ardelle BallsLuz M Ramirez-May is a 42 y.o. Female who is otherwise healthy, presents to the emergency department for evaluation after a syncopal episode.  She reports she woke up this morning and had some abdominal discomfort and an episode of nonbloody diarrhea, when she went to stand up to go back to bed she felt dizzy and lightheaded, reports her vision "went black".  She felt like she was going to pass out so she laid down on the ground.  When she tried to stand up again she felt similarly the third time she tried to stand up patient reports she thinks she did actually pass out, she reports her husband witnessed these events.  Husband reports he helped her to the floor and she did not fall or hit her head.  No history of similar symptoms in the past.  No preceding chest pain or shortness of breath.  She is felt a bit nauseated but has not had any vomiting.  Reports aside from the abdominal cramping she had before her diarrhea she is not having any current abdominal pain.  She no longer feels dizzy or lightheaded.  Patient reports that she believes EMS said that her blood pressure was low at her house.  She has not had any fevers or chills, no cough, shortness of breath or chest pain.  No rhinorrhea or other upper respiratory symptoms.  She denies dysuria or frequency.  Has not been ill recently, but reports she has been intermittently nauseated over the last few months and did not have much to eat or drink yesterday before going to bed.     Past Medical History:  Diagnosis Date  . Medical history non-contributory     Patient Active Problem List   Diagnosis Date Noted  . 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  Other disorder of menstruation and other abnormal bleeding from female genital tract 07/26/2012    Past Surgical History:  Procedure Laterality Date  . BREAST SURGERY     implants  . DILATION AND EVACUATION N/A 07/28/2012   Procedure: DILATATION AND EVACUATION;  Surgeon: Reva Boresanya S Pratt, MD;  Location: [facility];  Service: Gynecology;  Laterality: N/A;     OB History    Gravida  4   Para  2   Term  2   Preterm  0   AB  1   Living  2     SAB  1   TAB  0   Ectopic  0   Multiple  0   Live Births  2            Home Medications    Prior to Admission medications   Medication Sig Start Date End Date Taking? Authorizing Provider  benzonatate (TESSALON) 100 MG capsule Take 1 capsule (100 mg total) by mouth every 8 (eight) hours. 07/19/15   Tharon AquasPatrick, Frank C, PA  calcium carbonate (TUMS - DOSED IN MG ELEMENTAL CALCIUM) 500 MG chewable tablet Chew 1 tablet by mouth daily.    [provider]  doxycycline (VIBRAMYCIN) 100 MG capsule Take 1 capsule (100 mg total) by mouth 2 (two) times daily. Patient not taking: Reported on 05/23/2014 07/28/12  Reva Bores, MD  ibuprofen (ADVIL,MOTRIN) 600 MG tablet Take 1 tablet (600 mg total) by mouth every 6 (six) hours as needed for pain. Patient not taking: Reported on 05/23/2014 06/21/12   [name], CNM    Family History Family History  Problem Relation Age of Onset  . Hypertension Mother   . Hyperlipidemia Mother   . Hypertension Maternal Grandmother   . Hyperlipidemia Maternal Grandmother     Social History Social History   Tobacco Use  . Smoking status: Never Smoker  . Smokeless tobacco: Never Used  Substance Use Topics  . Alcohol use: No  . Drug use: No     Allergies   Patient has no known allergies.   Review of Systems Review of Systems  Constitutional: Negative for chills and fever.  HENT: Negative for congestion, rhinorrhea and sore throat.   Eyes: Negative for visual disturbance.  Respiratory:  Negative for cough and shortness of breath.   Cardiovascular: Negative for chest pain, palpitations and leg swelling.  Gastrointestinal: Positive for abdominal pain, diarrhea and nausea. Negative for blood in stool, constipation and vomiting.  Genitourinary: Negative for dysuria and frequency.  Musculoskeletal: Negative for arthralgias, back pain, myalgias and neck pain.  Skin: Negative for color change and rash.  Neurological: Positive for dizziness, syncope and light-headedness. Negative for seizures, facial asymmetry, speech difficulty, weakness, numbness and headaches.     Physical Exam Updated Vital Signs BP 118/76 (BP Location: Right Arm)   Pulse 68   Temp 98.2 F (36.8 C) (Oral)   Resp 15   LMP 11/22/2017   SpO2 100%   Physical Exam  Constitutional: She is oriented to person, place, and time. She appears well-developed and well-nourished. No distress.  HENT:  Head: Normocephalic and atraumatic.  Mouth/Throat: Oropharynx is clear and moist.  Eyes: Pupils are equal, round, and reactive to light. EOM are normal. Right eye exhibits no discharge. Left eye exhibits no discharge.  Neck: Neck supple.  Cardiovascular: Normal rate, regular rhythm, normal heart sounds and intact distal pulses.  Pulmonary/Chest: Effort normal and breath sounds normal. No stridor. No respiratory distress. She has no wheezes. She has no rales.  Respirations equal and unlabored, patient able to speak in full sentences, lungs clear to auscultation bilaterally  Abdominal: Soft. Bowel sounds are normal. She exhibits no distension and no mass. There is no tenderness. There is no guarding.  Abdomen soft, nondistended, nontender to palpation in all quadrants without guarding or peritoneal signs, no CVA tenderness bilaterally  Musculoskeletal: She exhibits no edema or deformity.  Neurological: She is alert and oriented to person, place, and time. Coordination normal.  Speech is clear, able to follow commands CN  III-XII intact Normal strength in upper and lower extremities bilaterally including dorsiflexion and plantar flexion, strong and equal grip strength Sensation normal to light and sharp touch Moves extremities without ataxia, coordination intact Normal finger to nose and rapid alternating movements No pronator drift  Skin: Skin is warm and dry. Capillary refill takes less than 2 seconds. She is not diaphoretic.  Psychiatric: She has a normal mood and affect. Her behavior is normal.  Nursing note and vitals reviewed.    ED Treatments / Results  Labs (all labs ordered are listed, but only abnormal results are displayed) Labs Reviewed  URINALYSIS, ROUTINE W REFLEX MICROSCOPIC - Abnormal; Notable for the following components:      Result Value   Color, Urine STRAW (*)    Hgb urine dipstick LARGE (*)  Bacteria, UA RARE (*)    All other components within normal limits  CBC WITH DIFFERENTIAL/PLATELET  COMPREHENSIVE METABOLIC PANEL  LIPASE, BLOOD  I-STAT BETA HCG BLOOD, ED ([facility], [facility], [facility] ONLY)  I-STAT TROPONIN, ED    EKG EKG Interpretation  Date/Time:  Wednesday November 23 2017 06:33:10 EDT Ventricular Rate:  76 PR Interval:  164 QRS Duration: 90 QT Interval:  400 QTC Calculation: 450 R Axis:   78 Text Interpretation:  Normal sinus rhythm Normal ECG No prior ECG for comparison.  No STEMI Confirmed by Theda Belfastegeler, Chris ([id number]) on 11/23/2017 8:57:45 AM   Radiology No results found.  Procedures Procedures (including critical care time)  Medications Ordered in ED Medications  sodium chloride 0.9 % bolus 1,000 mL (0 mLs Intravenous Stopped 11/23/17 0922)     Initial Impression / Assessment and Plan / ED Course  I have reviewed the triage vital signs and the nursing notes.  Pertinent labs & imaging results that were available during my care of the patient were reviewed by me and considered in my medical decision making (see chart for details).  Patient presents with syncopal  episode this morning after having a bout of diarrhea.  Abdominal exam is benign.  Patient without arrhythmia or tachycardia while here in the department.  Patient without history of congestive heart failure, normal ECG, no shortness of breath and systolic blood pressure greater than 90; patient is low risk.  Normal hemoglobin, no leukocytosis, no acute electrolyte derangements, negative troponin and negative pregnancy.  Urinalysis without signs of infection, large amount of hemoglobin but is currently on menstrual cycle.  After 1 L fluid bolus patient ambulatory in the department and completely asymptomatic.  Will plan for discharge home with close PCP follow-up.  Possibility of recurrent syncope has been discussed. I discussed reasons to avoid driving until cardiology/PCP followup and other safety prevention including use of ladders and working at heights.   Pt has remained hemodynamically stable throughout their time in the ED  BP 118/76 (BP Location: Right Arm)   Pulse 68   Temp 98.2 F (36.8 C) (Oral)   Resp 15   LMP 11/22/2017   SpO2 100%    Final Clinical Impressions(s) / ED Diagnoses   Final diagnoses:  Syncope and collapse  Dehydration  Diarrhea, unspecified type    ED Discharge Orders    None       Legrand RamsFord, Hadyn Azer N, PA-C 11/23/17 2104    Tegeler, Canary Brimhristopher J, MD 11/24/17 [phone]

## 2019-04-06 DIAGNOSIS — E7800 Pure hypercholesterolemia, unspecified: Secondary | ICD-10-CM

## 2019-04-06 HISTORY — DX: Pure hypercholesterolemia, unspecified: E78.00

## 2019-08-02 ENCOUNTER — Encounter: Payer: Self-pay | Admitting: Internal Medicine

## 2019-08-02 ENCOUNTER — Ambulatory Visit: Payer: Self-pay | Admitting: Internal Medicine

## 2019-08-02 ENCOUNTER — Other Ambulatory Visit: Payer: Self-pay

## 2019-08-02 VITALS — BP 122/78 | HR 78 | Resp 12 | Ht 62.0 in | Wt 156.0 lb

## 2019-08-02 DIAGNOSIS — R739 Hyperglycemia, unspecified: Secondary | ICD-10-CM

## 2019-08-02 DIAGNOSIS — O24410 Gestational diabetes mellitus in pregnancy, diet controlled: Secondary | ICD-10-CM

## 2019-08-02 DIAGNOSIS — F32A Depression, unspecified: Secondary | ICD-10-CM

## 2019-08-02 DIAGNOSIS — F329 Major depressive disorder, single episode, unspecified: Secondary | ICD-10-CM

## 2019-08-02 DIAGNOSIS — R011 Cardiac murmur, unspecified: Secondary | ICD-10-CM

## 2019-08-02 DIAGNOSIS — E663 Overweight: Secondary | ICD-10-CM

## 2019-08-02 LAB — GLUCOSE, POCT (MANUAL RESULT ENTRY): POC Glucose: 83 mg/dl (ref 70–99)

## 2019-08-02 NOTE — Progress Notes (Signed)
Social worker met with new patient who is scheduled with Dr. Salley Scarlet for medical visit. Social worker completed New Patient Questionnaire which included completion of housing, intimate partner violence, transportation needs, stress, financial resource strain, food insecurity, social connections and screeners of [phone]) and GAD-7(11).   Patient shared high score of PHQ9 due to the month of April is the anniversary of her babies death who passed in May 29, 2016 from an unknown disease. Patient shared she finds herself distracted mentally, begins to isolate not socialize with others and feels like to be happy to not ruin her other families happiness. Patient shared April/Spring and Autumn are the hardest as her baby was born in Autumn. Patient shared she did receive support from Hospice and it stopped due to counselor being told she was better.    Based on presentation Recommended counseling.  Social worker provided card if she does agree to see LCSW for counseling to contact. No definite answer was provided during this meeting. LCSW informed provider of presentation aside of medical reasoning.

## 2019-08-02 NOTE — Patient Instructions (Signed)

## 2019-08-02 NOTE — Progress Notes (Signed)
Subjective:    Patient ID: Tiffany [name], female   DOB: Sep 03, 1975, 44 y.o.   MRN: [id number]   HPI   Here to establish  1.  Gestational DM with last pregnancy in 2016.  She controlled with diet and exercise.  Has been checking her sugar in the morning fasting and has received results of 100 to 105.   Diabetes does not run in family. Has gained 20 lbs over past 6 months.  Breakfast:  Coffee, vanilla creamer and honeybun Lunch:  Rice beans, meat, Water.  Sweet tea Nothing else.    2.  Numbness and tingling in hands for 2 months:  Seems to happen when not eating a healthy meal.  When gets up and moves around--goes away.  Never occurs when asleep.  3.  Depression still since loss of baby boy.  Not clear what was cause of his progressive disease.       No outpatient medications have been marked as taking for the 08/02/19 encounter (Office Visit) with Julieanne Manson, MD.   No Known Allergies   Past Medical History:  Diagnosis Date  . DM (diabetes mellitus), gestational 2016    Past Surgical History:  Procedure Laterality Date  . BREAST SURGERY  2011   implants  . DILATION AND EVACUATION N/A 07/28/2012   Procedure: DILATATION AND EVACUATION;  Surgeon: Reva Bores, MD;  Location: [facility];  Service: Gynecology;  Laterality: N/A;    Family History  Problem Relation Age of Onset  . Hypertension Mother   . Hyperlipidemia Mother   . Hypertension Maternal Grandmother   . Hyperlipidemia Maternal Grandmother   . Gout Father   . Anxiety disorder Brother     Social History   Socioeconomic History  . Marital status: Married    Spouse name: Elige Radon  . Number of children: 1  . Years of education: 6  . Highest education level: 6th grade  Occupational History  . Occupation: Housewife  Tobacco Use  . Smoking status: Never Smoker  . Smokeless tobacco: Never Used  Vaping Use  . Vaping Use: Never used  Substance and Sexual Activity  . Alcohol use: No  . Drug use:  No  . Sexual activity: Yes    Birth control/protection: None  Other Topics Concern  . Not on file  Social History Narrative   Married.   Lives at home with husband and daughter   Social Determinants of Health   Financial Resource Strain: Low Risk   . Difficulty of Paying Living Expenses: Not hard at all  Food Insecurity: No Food Insecurity  . Worried About Programme researcher, broadcasting/film/video in the Last Year: Never true  . Ran Out of Food in the Last Year: Never true  Transportation Needs: No Transportation Needs  . Lack of Transportation (Medical): No  . Lack of Transportation (Non-Medical): No  Physical Activity:   . Days of Exercise per Week:   . Minutes of Exercise per Session:   Stress: Stress Concern Present  . Feeling of Stress : To some extent  Social Connections: Moderately Isolated  . Frequency of Communication with Friends and Family: Never  . Frequency of Social Gatherings with Friends and Family: Once a week  . Attends Religious Services: More than 4 times per year  . Active Member of Clubs or Organizations: No  . Attends Banker Meetings: Never  . Marital Status: Married  Catering manager Violence: Not At Risk  . Fear of Current or Ex-Partner:  No  . Emotionally Abused: No  . Physically Abused: No  . Sexually Abused: No       Review of Systems    Objective:   BP 122/78 (BP Location: Left Arm, Patient Position: Sitting, Cuff Size: Normal)   Pulse 78   Resp 12   Ht 5\' 2"  (1.575 m)   Wt 156 lb (70.8 kg)   LMP 07/14/2019   BMI 28.53 kg/m   Physical Exam  NAD HEENT:  PERRL, EOMI, TMs pearly gray.  Throat without injection Neck:  Supple No adenopathy, no thyromegaly Chest:  CTA CV:  RRR with Grade 2/6 Systolic murmur and perhaps early diastolic murmur.  No S3, S4 or rub.  Carotid, radial and DP pulses normal and equal. Abd:  S, NT, No HSM or mass, + BS LE:  No edema.     Assessment & Plan   1.  Overweight and history of GDM:  A1C, CMP, FLP.   Discussed making weekly small goals for dietary and physical activity changes for weight loss.  2.  Heart murmur:  Cardiology referral. No cardiac symptoms.  3.  Depression:  Believe following loss of young son, who apparently had a progressive disease.  Will continue to address and offer support with Dwan Bolt, LCSW.

## 2019-08-03 LAB — CBC WITH DIFFERENTIAL/PLATELET
Basophils Absolute: 0 10*3/uL (ref 0.0–0.2)
Basos: 0 %
EOS (ABSOLUTE): 0 10*3/uL (ref 0.0–0.4)
Eos: 0 %
Hematocrit: 43 % (ref 34.0–46.6)
Hemoglobin: 14.3 g/dL (ref 11.1–15.9)
Immature Grans (Abs): 0 10*3/uL (ref 0.0–0.1)
Immature Granulocytes: 0 %
Lymphocytes Absolute: 1.9 10*3/uL (ref 0.7–3.1)
Lymphs: 20 %
MCH: 30.3 pg (ref 26.6–33.0)
MCHC: 33.3 g/dL (ref 31.5–35.7)
MCV: 91 fL (ref 79–97)
Monocytes Absolute: 0.6 10*3/uL (ref 0.1–0.9)
Monocytes: 6 %
Neutrophils Absolute: 7 10*3/uL (ref 1.4–7.0)
Neutrophils: 74 %
Platelets: 263 10*3/uL (ref 150–450)
RBC: 4.72 x10E6/uL (ref 3.77–5.28)
RDW: 12.8 % (ref 11.7–15.4)
WBC: 9.4 10*3/uL (ref 3.4–10.8)

## 2019-08-03 LAB — COMPREHENSIVE METABOLIC PANEL
ALT: 21 IU/L (ref 0–32)
AST: 22 IU/L (ref 0–40)
Albumin/Globulin Ratio: 1.6 (ref 1.2–2.2)
Albumin: 4.7 g/dL (ref 3.8–4.8)
Alkaline Phosphatase: 137 IU/L — ABNORMAL HIGH (ref 39–117)
BUN/Creatinine Ratio: 12 (ref 9–23)
BUN: 9 mg/dL (ref 6–24)
Bilirubin Total: 0.5 mg/dL (ref 0.0–1.2)
CO2: 20 mmol/L (ref 20–29)
Calcium: 9.6 mg/dL (ref 8.7–10.2)
Chloride: 104 mmol/L (ref 96–106)
Creatinine, Ser: 0.78 mg/dL (ref 0.57–1.00)
GFR calc Af Amer: 107 mL/min/{1.73_m2} (ref >59)
GFR calc non Af Amer: 93 mL/min/{1.73_m2} (ref >59)
Globulin, Total: 2.9 g/dL (ref 1.5–4.5)
Glucose: 80 mg/dL (ref 65–99)
Potassium: 4.3 mmol/L (ref 3.5–5.2)
Sodium: 139 mmol/L (ref 134–144)
Total Protein: 7.6 g/dL (ref 6.0–8.5)

## 2019-08-03 LAB — LIPID PANEL W/O CHOL/HDL RATIO
Cholesterol, Total: 201 mg/dL — ABNORMAL HIGH (ref 100–199)
HDL: 49 mg/dL (ref >39)
LDL Chol Calc (NIH): 138 mg/dL — ABNORMAL HIGH (ref 0–99)
Triglycerides: 75 mg/dL (ref 0–149)
VLDL Cholesterol Cal: 14 mg/dL (ref 5–40)

## 2019-08-03 LAB — HGB A1C W/O EAG: Hgb A1c MFr Bld: 5.5 % (ref 4.8–5.6)

## 2019-08-29 ENCOUNTER — Telehealth: Payer: Self-pay | Admitting: Interventional Cardiology

## 2019-08-29 NOTE — Telephone Encounter (Signed)
I will forward to Dr. Hoyle Barr nurse for further follow up.

## 2019-08-29 NOTE — Telephone Encounter (Signed)
Tiffany [name] is calling stating she is returning a call from our office. I was unable to find any documentation of a call. The patient's first appointment with our office is not scheduled until 09/07/19 so it was not the automated appointment reminder call. Please advise.

## 2019-08-29 NOTE — Telephone Encounter (Signed)
Left message letting patient know there was no record of any call and we did not need anything at this time. Instructed for patient to call back with any questions or concerns that need to be addressed.

## 2019-09-07 ENCOUNTER — Other Ambulatory Visit: Payer: Self-pay

## 2019-09-07 ENCOUNTER — Ambulatory Visit (INDEPENDENT_AMBULATORY_CARE_PROVIDER_SITE_OTHER): Payer: Self-pay | Admitting: Interventional Cardiology

## 2019-09-07 ENCOUNTER — Encounter (INDEPENDENT_AMBULATORY_CARE_PROVIDER_SITE_OTHER): Payer: Self-pay

## 2019-09-07 ENCOUNTER — Encounter: Payer: Self-pay | Admitting: Interventional Cardiology

## 2019-09-07 VITALS — BP 132/88 | HR 84 | Ht 62.0 in | Wt 151.4 lb

## 2019-09-07 DIAGNOSIS — E782 Mixed hyperlipidemia: Secondary | ICD-10-CM

## 2019-09-07 DIAGNOSIS — R011 Cardiac murmur, unspecified: Secondary | ICD-10-CM

## 2019-09-07 NOTE — Patient Instructions (Signed)
Medication Instructions:  Your physician recommends that you continue on your current medications as directed. Please refer to the Current Medication list given to you today.  *If you need a refill on your cardiac medications before your next appointment, please call your pharmacy*   Lab Work: None  If you have labs (blood work) drawn today and your tests are completely normal, you will receive your results only by: Marland Kitchen MyChart Message (if you have MyChart) OR . A paper copy in the mail If you have any lab test that is abnormal or we need to change your treatment, we will call you to review the results.   Testing/Procedures: Your physician has requested that you have an echocardiogram. Echocardiography is a painless test that uses sound waves to create images of your heart. It provides your doctor with information about the size and shape of your heart and how well your heart's chambers and valves are working. This procedure takes approximately one hour. There are no restrictions for this procedure.  Follow-Up: Based on results   Other Instructions  High-Fiber Diet Fiber, also called dietary fiber, is a type of carbohydrate that is found in fruits, vegetables, whole grains, and beans. A high-fiber diet can have many health benefits. Your health care provider may recommend a high-fiber diet to help:  Prevent constipation. Fiber can make your bowel movements more regular.  Lower your cholesterol.  Relieve the following conditions: ? Swelling of veins in the anus (hemorrhoids). ? Swelling and irritation (inflammation) of specific areas of the digestive tract (uncomplicated diverticulosis). ? A problem of the large intestine (colon) that sometimes causes pain and diarrhea (irritable bowel syndrome, IBS).  Prevent overeating as part of a weight-loss plan.  Prevent heart disease, type 2 diabetes, and certain cancers. What is my plan? The recommended daily fiber intake in grams (g)  includes:  38 g for men age 42 or younger.  30 g for men over age 37.  88 g for women age 31 or younger.  21 g for women over age 16. You can get the recommended daily intake of dietary fiber by:  Eating a variety of fruits, vegetables, grains, and beans.  Taking a fiber supplement, if it is not possible to get enough fiber through your diet. What do I need to know about a high-fiber diet?  It is better to get fiber through food sources rather than from fiber supplements. There is not a lot of research about how effective supplements are.  Always check the fiber content on the nutrition facts label of any prepackaged food. Look for foods that contain 5 g of fiber or more per serving.  Talk with a diet and nutrition specialist (dietitian) if you have questions about specific foods that are recommended or not recommended for your medical condition, especially if those foods are not listed below.  Gradually increase how much fiber you consume. If you increase your intake of dietary fiber too quickly, you may have bloating, cramping, or gas.  Drink plenty of water. Water helps you to digest fiber. What are tips for following this plan?  Eat a wide variety of high-fiber foods.  Make sure that half of the grains that you eat each day are whole grains.  Eat breads and cereals that are made with whole-grain flour instead of refined flour or white flour.  Eat brown rice, bulgur wheat, or millet instead of white rice.  Start the day with a breakfast that is high in fiber, such as  a cereal that contains 5 g of fiber or more per serving.  Use beans in place of meat in soups, salads, and pasta dishes.  Eat high-fiber snacks, such as berries, raw vegetables, nuts, and popcorn.  Choose whole fruits and vegetables instead of processed forms like juice or sauce. What foods can I eat?  Fruits Berries. Pears. Apples. Oranges. Avocado. Prunes and raisins. Dried figs. Vegetables Sweet  potatoes. Spinach. Kale. Artichokes. Cabbage. Broccoli. Cauliflower. Green peas. Carrots. Squash. Grains Whole-grain breads. Multigrain cereal. Oats and oatmeal. Brown rice. Barley. Bulgur wheat. Millet. Quinoa. Bran muffins. Popcorn. Rye wafer crackers. Meats and other proteins Navy, kidney, and pinto beans. Soybeans. Split peas. Lentils. Nuts and seeds. Dairy Fiber-fortified yogurt. Beverages Fiber-fortified soy milk. Fiber-fortified orange juice. Other foods Fiber bars. The items listed above may not be a complete list of recommended foods and beverages. Contact a dietitian for more options. What foods are not recommended? Fruits Fruit juice. Cooked, strained fruit. Vegetables Fried potatoes. Canned vegetables. Well-cooked vegetables. Grains White bread. Pasta made with refined flour. White rice. Meats and other proteins Fatty cuts of meat. Fried chicken or fried fish. Dairy Milk. Yogurt. Cream cheese. Sour cream. Fats and oils Butters. Beverages Soft drinks. Other foods Cakes and pastries. The items listed above may not be a complete list of foods and beverages to avoid. Contact a dietitian for more information. Summary  Fiber is a type of carbohydrate. It is found in fruits, vegetables, whole grains, and beans.  There are many health benefits of eating a high-fiber diet, such as preventing constipation, lowering blood cholesterol, helping with weight loss, and reducing your risk of heart disease, diabetes, and certain cancers.  Gradually increase your intake of fiber. Increasing too fast can result in cramping, bloating, and gas. Drink plenty of water while you increase your fiber.  The best sources of fiber include whole fruits and vegetables, whole grains, nuts, seeds, and beans. This information is not intended to replace advice given to you by your health care provider. Make sure you discuss any questions you have with your health care provider. Document Revised:  01/24/2017 Document Reviewed: 01/24/2017 Elsevier Patient Education  2020 [employer].

## 2019-09-07 NOTE — Progress Notes (Signed)
Cardiology Office Note   Date:  09/07/2019   ID:  Tiffany [name], DOB 1975-04-30, MRN [id number]  PCP:  Julieanne Manson, MD    No chief complaint on file.  Heart murmur  Wt Readings from Last 3 Encounters:  09/07/19 151 lb 6.4 oz (68.7 kg)  08/02/19 156 lb (70.8 kg)  05/23/14 144 lb (65.3 kg)       History of Present Illness: Tiffany [name] is a 44 y.o. female who is being seen today for the evaluation of heart murmur at the request of Julieanne Manson, MD.  Her PMD heart the murmur in 3/21.  She was not having sx.  SHe had gestational diabetes. She has had some high cholesterol readings in the past.  She is trying to eat a lower carb diet and lose weight.    Grandmother with some type of heart problem, and died from a stroke.    Denies : Chest pain. Dizziness. Leg edema. Nitroglycerin use. Orthopnea. Paroxysmal nocturnal dyspnea. Shortness of breath. Syncope.     Past Medical History:  Diagnosis Date  . Medical history non-contributory     Past Surgical History:  Procedure Laterality Date  . BREAST SURGERY  2011   implants  . DILATION AND EVACUATION N/A 07/28/2012   Procedure: DILATATION AND EVACUATION;  Surgeon: Reva Bores, MD;  Location: [facility];  Service: Gynecology;  Laterality: N/A;     Current Outpatient Medications  Medication Sig Dispense Refill  . ibuprofen (ADVIL,MOTRIN) 600 MG tablet Take 1 tablet (600 mg total) by mouth every 6 (six) hours as needed for pain. 30 tablet 1   No current facility-administered medications for this visit.    Allergies:   Patient has no known allergies.    Social History:  The patient  reports that she has never smoked. She has never used smokeless tobacco. She reports that she does not drink alcohol or use drugs.   Family History:  The patient's family history includes Anxiety disorder in her brother; Gout in her father; Hyperlipidemia in her maternal grandmother and mother; Hypertension in her  maternal grandmother and mother.    ROS:  Please see the history of present illness.   Otherwise, review of systems are positive for rare palpitations- shortlived.   All other systems are reviewed and negative.    PHYSICAL EXAM: VS:  BP 132/88   Pulse 84   Ht 5\' 2"  (1.575 m)   Wt 151 lb 6.4 oz (68.7 kg)   SpO2 99%   BMI 27.69 kg/m  , BMI Body mass index is 27.69 kg/m. GEN: Well nourished, well developed, in no acute distress  HEENT: normal  Neck: no JVD, carotid bruits, or masses Cardiac: RRR; 2/6 early systolic murmur, no rubs, or gallops,no edema  Respiratory:  clear to auscultation bilaterally, normal work of breathing GI: soft, nontender, nondistended, + BS MS: no deformity or atrophy  Skin: warm and dry, no rash Neuro:  Strength and sensation are intact Psych: euthymic mood, full affect   EKG:   The ekg ordered today demonstrates NSR, IRBBB, no ST segment   Recent Labs: 08/02/2019: ALT 21; BUN 9; Creatinine, Ser 0.78; Hemoglobin 14.3; Platelets 263; Potassium 4.3; Sodium 139   Lipid Panel    Component Value Date/Time   CHOL 201 (H) 08/02/2019 1350   TRIG 75 08/02/2019 1350   HDL 49 08/02/2019 1350   LDLCALC 138 (H) 08/02/2019 1350     Other studies Reviewed: Additional studies/ records that  were reviewed today with results demonstrating: .   ASSESSMENT AND PLAN:  1. Heart Murmur: Plan for echocardiogram to further evaluate.  No signs of heart failure on exam. 2. Hyperlipidemia: We spoke about healthy diet.  LDL slightly above target.  She is trying to improve her diet.  I instructed her to decrease meat intake.  She should try to follow a whole food, plant-based diet as much as possible.  Increase fiber intake.  This will also be good for her sugar.   3. H/o gestational DM:  THis does increase her risk of CV events in the future.  A1C 5.5.   Current medicines are reviewed at length with the patient today.  The patient concerns regarding her medicines were  addressed.  The following changes have been made:  No change  Labs/ tests ordered today include:  No orders of the defined types were placed in this encounter.   Recommend 150 minutes/week of aerobic exercise Low fat, low carb, high fiber diet recommended  Disposition:   FU for echo   Signed, Larae Grooms, MD  09/07/2019 2:17 PM    [facility] [address], [place], [place]  [address] Phone: [phone]; Fax: [phone]

## 2019-10-01 ENCOUNTER — Other Ambulatory Visit: Payer: Self-pay

## 2019-10-01 ENCOUNTER — Ambulatory Visit (HOSPITAL_COMMUNITY): Payer: No Typology Code available for payment source | Attending: Cardiovascular Disease

## 2019-10-01 DIAGNOSIS — R011 Cardiac murmur, unspecified: Secondary | ICD-10-CM

## 2019-10-28 ENCOUNTER — Encounter: Payer: Self-pay | Admitting: Internal Medicine

## 2019-10-28 DIAGNOSIS — R011 Cardiac murmur, unspecified: Secondary | ICD-10-CM | POA: Insufficient documentation

## 2019-10-28 DIAGNOSIS — R739 Hyperglycemia, unspecified: Secondary | ICD-10-CM | POA: Insufficient documentation

## 2019-10-28 DIAGNOSIS — E663 Overweight: Secondary | ICD-10-CM | POA: Insufficient documentation

## 2019-10-28 DIAGNOSIS — F32A Depression, unspecified: Secondary | ICD-10-CM | POA: Insufficient documentation

## 2019-10-28 DIAGNOSIS — O24419 Gestational diabetes mellitus in pregnancy, unspecified control: Secondary | ICD-10-CM | POA: Insufficient documentation

## 2019-11-02 ENCOUNTER — Telehealth (INDEPENDENT_AMBULATORY_CARE_PROVIDER_SITE_OTHER): Payer: Self-pay | Admitting: Internal Medicine

## 2019-11-02 DIAGNOSIS — R011 Cardiac murmur, unspecified: Secondary | ICD-10-CM

## 2019-11-02 DIAGNOSIS — E663 Overweight: Secondary | ICD-10-CM

## 2019-11-02 DIAGNOSIS — Z1231 Encounter for screening mammogram for malignant neoplasm of breast: Secondary | ICD-10-CM

## 2019-11-02 DIAGNOSIS — R739 Hyperglycemia, unspecified: Secondary | ICD-10-CM

## 2019-11-02 DIAGNOSIS — E7800 Pure hypercholesterolemia, unspecified: Secondary | ICD-10-CM

## 2019-11-02 NOTE — Progress Notes (Signed)
    Subjective:    Patient ID: Tiffany [name], female   DOB: 1975/11/10, 44 y.o.   MRN: [id number]   HPI   Virtual Visit with [facility]  Follow up for weight, hypercholesterolemia, heart murmur, GDM:  Weight down to 149.6.  She has cut out all sweet beverages.  Drinking tea instead of coffee as cannot drink coffee without sugar. Eating fewer carbs with foods as well. She walks trails 2 times weekly for an hour and swims once weekly.  Occasionally, does Zumba at home.  She enjoys the physical activity.  Discussed her A1C of 5.5% is in high normal range and keeping her weight down with a healthy lifestyle is important to prevent progression to prediabetes/diabetes.  Hypercholesterolemia:  LDL and total a bit high.  Discussed her lifestyle changes should help improve this as well.  Heart Murmur:  Discussed Echo with Dr. Eldridge Dace was fine.  Trace Mitral and Tricuspid Regurgitation only findings.  No concerns.   Discussion of what causes heart murmurs.    Health Maintenance:  No CPE with pap since 2016 after birth of last child.  She has never had a mammogram.  No COVID vaccination.  She and her husband nor her 81 yo child have been vaccinated.  Long discussion regarding her concerns.    No outpatient medications have been marked as taking for the 11/02/19 encounter (Telemedicine) with Julieanne Manson, MD.   No Known Allergies   Review of Systems    Objective:   There were no vitals taken for this visit.  Physical Exam  Smiling, looks well.   Assessment & Plan   1.  Overweight/History of GDM with hyperglycemia/hypercholesterolemia:  Continue to work on eating habits and increasing physical activity.  Will recheck cholesterol at CPE  2.  Heart Murmur:  Trace MR and TR only.  Discussed no concern.    3.  HM:  She will discuss coming in for COVID vaccination with her husband.   She can bring her child as well.  Mammogram ordered.  45 minutes with patient

## 2020-02-01 ENCOUNTER — Other Ambulatory Visit: Payer: Self-pay | Admitting: Internal Medicine

## 2020-02-01 ENCOUNTER — Encounter: Payer: Self-pay | Admitting: Internal Medicine

## 2020-02-01 ENCOUNTER — Ambulatory Visit: Payer: Self-pay | Admitting: Internal Medicine

## 2020-02-01 VITALS — BP 125/88 | Resp 12 | Ht 62.0 in | Wt 146.0 lb

## 2020-02-01 DIAGNOSIS — Z124 Encounter for screening for malignant neoplasm of cervix: Secondary | ICD-10-CM

## 2020-02-01 DIAGNOSIS — Z0000 Encounter for general adult medical examination without abnormal findings: Secondary | ICD-10-CM

## 2020-02-01 DIAGNOSIS — F419 Anxiety disorder, unspecified: Secondary | ICD-10-CM

## 2020-02-01 DIAGNOSIS — E7800 Pure hypercholesterolemia, unspecified: Secondary | ICD-10-CM

## 2020-02-01 DIAGNOSIS — Z9189 Other specified personal risk factors, not elsewhere classified: Secondary | ICD-10-CM

## 2020-02-01 DIAGNOSIS — Z1231 Encounter for screening mammogram for malignant neoplasm of breast: Secondary | ICD-10-CM

## 2020-02-01 NOTE — Progress Notes (Signed)
Subjective:    Patient ID: Tiffany [name], female   DOB: 1975/08/09, 44 y.o.   MRN: [id number]   HPI  CPE with pap  1.  Pap:  Last 2016 and normal.  Not in this chart.  2.  Mammogram:  Never obtained.    3.  Osteoprevention:  Drinks 1 cup almond milk daily.  Would be willing to increase her milk intake.  She is doing Zumba every other day.  4.  Guaiac Cards:  Never.    5.  Colonoscopy:  Never.  No family history of colon cancer.  6.  Immunizations:   Immunization History  Administered Date(s) Administered   Influenza-Unspecified 01/30/2015   Tdap 02/04/2015     7.  Glucose/Cholesterol:  History of GDM.  A1C in April of this year in normal range at 5.5%.  Cholesterol was a bit high in April with elevated LDL.  She has made significant changes to her diet and is physically active.  Has lost 10 lbs since April.  Nonfasting today--had yogurt about 1 hour ago   No outpatient medications have been marked as taking for the 02/01/20 encounter (Office Visit) with Julieanne Manson, MD.   No Known Allergies  Past Medical History:  Diagnosis Date   DM (diabetes mellitus), gestational 2016   Hypercholesteremia 2021    Past Surgical History:  Procedure Laterality Date   BREAST SURGERY  2011   implants   DILATION AND EVACUATION N/A 07/28/2012   Procedure: DILATATION AND EVACUATION;  Surgeon: Reva Bores, MD;  Location: [facility];  Service: Gynecology;  Laterality: N/A;   Family History  Problem Relation Age of Onset   Hypertension Mother    Hyperlipidemia Mother    Heart disease Mother        pacemaker--for bradycardia   Gout Father    Psoriasis Sister        with dermatomyositis   Anxiety disorder Brother    Other Son        unknown genetic disorder   Hypertension Maternal Grandmother    Hyperlipidemia Maternal Grandmother    Social History   Socioeconomic History   Marital status: Married    Spouse name: Elige Radon   Number of children: 1   Years of  education: 6   Highest education level: 6th grade  Occupational History   Occupation: Housewife  Tobacco Use   Smoking status: Never   Smokeless tobacco: Never  Vaping Use   Vaping Use: Never used  Substance and Sexual Activity   Alcohol use: No   Drug use: No   Sexual activity: Yes    Birth control/protection: None  Other Topics Concern   Not on file  Social History Narrative   Married.   Lives at home with husband and daughter   Social Determinants of Health   Financial Resource Strain: Not on file  Food Insecurity: Not on file  Transportation Needs: Not on file  Physical Activity: Not on file  Stress: Not on file  Social Connections: Not on file  Intimate Partner Violence: Not on file        Review of Systems    Objective:   BP 125/88 (BP Location: Left Arm, Patient Position: Sitting, Cuff Size: Normal)    Resp 12    Ht 5\' 2"  (1.575 m)    Wt 146 lb (66.2 kg)    LMP 01/05/2020 (Exact Date)    BMI 26.70 kg/m   Physical Exam HENT:     Head:  Normocephalic and atraumatic.     Right Ear: Tympanic membrane, ear canal and external ear normal.     Left Ear: Tympanic membrane, ear canal and external ear normal.     Nose: Nose normal.     Mouth/Throat:     Mouth: Mucous membranes are moist.     Pharynx: Oropharynx is clear.  Eyes:     Extraocular Movements: Extraocular movements intact.     Conjunctiva/sclera: Conjunctivae normal.     Pupils: Pupils are equal, round, and reactive to light.     Comments: Discs sharp bilaterally.  Neck:     Thyroid: No thyroid mass or thyromegaly.  Cardiovascular:     Rate and Rhythm: Normal rate and regular rhythm.     Heart sounds: S1 normal and S2 normal. No murmur heard.   No friction rub. No S3 or S4 sounds.     Comments: No carotid bruits.  Carotid, radial, femoral, DP and PT pulses normal and equal.    Pulmonary:     Effort: Pulmonary effort is normal.     Breath sounds: Normal breath sounds.  Chest:  Breasts:     Right: No inverted nipple, mass, nipple discharge, skin change or tenderness.     Left: No inverted nipple, mass, nipple discharge, skin change or tenderness.  Abdominal:     General: Bowel sounds are normal.     Palpations: Abdomen is soft. There is no hepatomegaly, splenomegaly or mass.     Tenderness: There is no abdominal tenderness.     Hernia: No hernia is present.  Genitourinary:    Comments: Normal external female genitalia No vaginal discharge. Cervix with prominent extropion, but normal No uterine or adnexal mass or tenderness. Musculoskeletal:        General: Normal range of motion.     Cervical back: Normal range of motion and neck supple.     Right lower leg: No edema.     Left lower leg: No edema.  Lymphadenopathy:     Head:     Right side of head: No submental or submandibular adenopathy.     Left side of head: No submental or submandibular adenopathy.     Cervical: No cervical adenopathy.     Upper Body:     Right upper body: No supraclavicular or axillary adenopathy.     Left upper body: No supraclavicular or axillary adenopathy.     Lower Body: No right inguinal adenopathy. No left inguinal adenopathy.  Skin:    General: Skin is warm.     Capillary Refill: Capillary refill takes less than 2 seconds.     Findings: No rash.  Neurological:     Mental Status: She is alert.     Cranial Nerves: Cranial nerves are intact.     Sensory: Sensation is intact.     Motor: Motor function is intact.     Coordination: Coordination is intact.     Gait: Gait is intact.     Deep Tendon Reflexes: Reflexes are normal and symmetric.  Psychiatric:        Attention and Perception: Attention normal.        Mood and Affect: Mood normal.        Speech: Speech normal.        Behavior: Behavior normal. Behavior is cooperative.   Systolic murmur still apparent mildly to carotids and apex--echo normal in June Anxious extropion of cervix prominent Wet prep normal Assessment & Plan    CPE with pap Guaiac  cards x 3 to return in 2 weeks Mammogram ordered.   Recommend influenza and COVID vaccinations.  Patient refusing today as she does not like needles.   2.  Anxiety:  Referral to Danton Clap, LCSW for evaluation and counseling.  3.  Heart murmur:  Normal anatomy via Echo with Dr. Eldridge Dace in June 2021.  4.  Hypercholesterolemia:  Recheck FLP.  4.  Need for Dental Care:  dental referral.

## 2020-02-02 LAB — LIPID PANEL W/O CHOL/HDL RATIO
Cholesterol, Total: 193 mg/dL (ref 100–199)
HDL: 46 mg/dL (ref >39)
LDL Chol Calc (NIH): 134 mg/dL — ABNORMAL HIGH (ref 0–99)
Triglycerides: 73 mg/dL (ref 0–149)
VLDL Cholesterol Cal: 13 mg/dL (ref 5–40)

## 2020-02-05 ENCOUNTER — Telehealth: Payer: Self-pay | Admitting: Internal Medicine

## 2020-02-05 LAB — CYTOLOGY - PAP

## 2020-02-05 NOTE — Telephone Encounter (Signed)
Patient called because she was order a fecal test, she did not read instructions initially that she cannot eat cauliflower before the test. She ate cauliflower pizza before taking the test and wants to know if she needs to retake the test or if it is okay to send it out like that.    Please advise.

## 2020-02-05 NOTE — Telephone Encounter (Signed)
Okay per Dr. Delrae Alfred to send in test. To Britt Boozer to call patient spanish speaking

## 2020-02-06 ENCOUNTER — Telehealth: Payer: Self-pay | Admitting: Clinical

## 2020-02-06 NOTE — Telephone Encounter (Signed)
LCSW contated patient to check in if she was interested in therapy as a referral from PCP. Appointment provided.

## 2020-02-06 NOTE — Telephone Encounter (Signed)
Pt. Informed.

## 2020-02-13 ENCOUNTER — Ambulatory Visit: Payer: Self-pay | Admitting: Clinical

## 2020-02-13 DIAGNOSIS — F410 Panic disorder [episodic paroxysmal anxiety] without agoraphobia: Secondary | ICD-10-CM

## 2020-02-13 DIAGNOSIS — F32A Depression, unspecified: Secondary | ICD-10-CM

## 2020-02-13 DIAGNOSIS — F411 Generalized anxiety disorder: Secondary | ICD-10-CM

## 2020-02-15 ENCOUNTER — Other Ambulatory Visit (INDEPENDENT_AMBULATORY_CARE_PROVIDER_SITE_OTHER): Payer: Self-pay

## 2020-02-15 DIAGNOSIS — Z1211 Encounter for screening for malignant neoplasm of colon: Secondary | ICD-10-CM

## 2020-02-15 LAB — POC HEMOCCULT BLD/STL (HOME/3-CARD/SCREEN)
Card #2 Fecal Occult Blod, POC: NEGATIVE
Card #3 Fecal Occult Blood, POC: NEGATIVE
Fecal Occult Blood, POC: NEGATIVE

## 2020-02-15 NOTE — Progress Notes (Signed)
Dropped off Guaiac cards --all 3 sets tested negative for occult blood

## 2020-02-19 DIAGNOSIS — F410 Panic disorder [episodic paroxysmal anxiety] without agoraphobia: Secondary | ICD-10-CM | POA: Insufficient documentation
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
## 2020-02-27 ENCOUNTER — Ambulatory Visit: Payer: Self-pay | Admitting: Clinical

## 2020-02-27 ENCOUNTER — Other Ambulatory Visit: Payer: Self-pay

## 2020-02-27 DIAGNOSIS — F410 Panic disorder [episodic paroxysmal anxiety] without agoraphobia: Secondary | ICD-10-CM

## 2020-02-27 DIAGNOSIS — F411 Generalized anxiety disorder: Secondary | ICD-10-CM

## 2020-03-04 NOTE — Progress Notes (Signed)
   THERAPY PROGRESS NOTE  Session Time: 11:00-12:00pm Participation Level: Active Behavioral Response: CasualAlertAnxious Type of Therapy: Individual Therapy Treatment Goals addressed: Anxiety and Coping   Purpose: LCSW met with client for routine individual therapy to work towards treatment goals: learning coping skills to manage anxiety symptoms.   Intervention: LCSW met with client for routine individual therapy to work towards treatment goals: learning coping skills to manage anxiety symptoms. LCSW provided patient opportunity to check in how she is doing since CCA. LCSW provided patient opportunity to process any feelings or anything on mind today. For this session LCSW provided introduction to anxiety. LCSW provided patient education on the types of anxiety, differences and treatment modalities that will be used. LCSW utilized intervention of CBT by discussing the thoughts, feeling and behavior pattern and it's role how it can increase anxiety. This session patient processed upcoming anxiety regarding Thanksgiving with her family and discussed with LCSW plan to prepare and cope with her anxiety. LCSW provided coping skills of deep breathing as a relaxation skill. LCSW assessed for SI/HI/command psychosis.  Effectiveness: Patient is alert x4 affect. Patient identified she is good today. Patient participated in this session as she processed her anxiety symptoms and what they feel like. Patient identified she is accepting she does have anxiety, and since she has began to talk about it she has noted a slight difference. Patient processed she hasn't told her daughter who has asked why she goes to the doctor often. Patient reported she is planning to tell her daughter of her anxiety to be there for her daughter if it were to occur to her. Patient identified growing up she might've had anxiety but was taught to suppress it.   Patient shared she is anxious about Thanksgiving as she will be surrounded  by people. Patient identified her warning signs from a lot of people talking, stops listening/can't concentrate, chills and eventually begins to feel dizy or faint (once has occurred). Patient shared in the past she would either leave the gathering or sleep. Patient identified her plan is to go to a room and practice the deep breathing until she feels comfortable to return to the gather. Intervention was effective as patient was able to reflect, and participate in the session. Progress towards goal is Ongoing. Patient denied active suicidal/homicidal/active psychosis.  Plan Patient offered next appointment for: 12/15  Diagnosis: Generalized Anxiety Disorder    Lujean Rave, LCSW 03/04/2020

## 2020-03-13 ENCOUNTER — Ambulatory Visit: Payer: Self-pay | Admitting: Clinical

## 2020-03-13 ENCOUNTER — Other Ambulatory Visit: Payer: Self-pay

## 2020-03-13 DIAGNOSIS — F411 Generalized anxiety disorder: Secondary | ICD-10-CM

## 2020-03-13 DIAGNOSIS — F410 Panic disorder [episodic paroxysmal anxiety] without agoraphobia: Secondary | ICD-10-CM

## 2020-03-20 NOTE — Progress Notes (Signed)
   THERAPY PROGRESS NOTE  Session Time: 11:00-12:00pm Participation Level: Active Behavioral Response: CasualAlertAnxious Type of Therapy: Individual Therapy Treatment Goals addressed: Anxiety and Coping   Purpose: LCSW met with client for routine individual therapy to work towards treatment goals: learning coping skills to manage anxiety symptoms.   Intervention: LCSW met with client for routine individual therapy to work towards treatment goals: learning coping skills to manage anxiety symptoms. LCSW provided patient a brief check in to assess for any significant events and how she is doing today. LCSW provided reflective listening as patient processed recent feelings and events. LCSW utilized intervention of DBT skills within a CBT setting to discuss skill: Wave Skill- Mindfulness of Current Emotion- purpose is observing the emotion, experiencing the emotion, and to accept it to help let go of the emotional suffering. LCSW and patient discussed potential exercise to practice in the future for patient to practice and have the opportunity to process her feelings and thoughts to her mom.  LCSW assessed for SI/HI/command psychosis.  Effectiveness: Patient is alert x4 affect. Patient identified she is feeling good and thanksgiving went okay. Patient reports she felt emotional learning about an older woman passing away and this has made her feeling sad and thinking about when her parents pass away.   Patient reports recently observing an uncomfortable emotion regarding mom knowing her sister is visiting her parents, patient shared she avoids looking at the pictures her sister sends, and when she talks to her mom it's short. Patient processed she holds on to her emotions and thoughts and wants to share with mom but her sisters have told her not to to not make her mom sick and worried. Patient reports she wants to ask mom why she treated her different compared to her siblings, reports she loves her mom  but is curious as to why. Patient reports she feels like asking her this will help with the healing process. Patient reports talking and feeling this she wants to ask her son if she treated him differently and if he has any feeling regarding this to have the opportunity to process together and continue to grow her relationship with her son as she hasn't had the opportunity herself with her mom. Patient accepted strategies to let out her thoughts via writing a letter to mom or practicing in session on day.  Intervention was effective with DBT skill of Wave skill, patient acknowledged the importance of processing her feelings and thoughts as by holding them and pushing them away causing more emotional suffering. Patient shared a memory when her husband showed her how to swim at the beach and he said if you stand there the wave will knock you over but the right way is to go through it, patient reports she will take this memory to remember to ride through her emotion. Patient reflected on this session reporting "liberated"as she was able to express her current feelings.   Progress towards goal is Ongoing. Patient denied active suicidal/homicidal/active psychosis.  Plan Patient offered next appointment for: 12/23 11am  Diagnosis: Generalized Anxiety Disorder    Tiffany P Tol, LCSW 03/20/2020   

## 2020-03-27 ENCOUNTER — Other Ambulatory Visit: Payer: Self-pay

## 2020-03-27 ENCOUNTER — Ambulatory Visit: Payer: Self-pay | Admitting: Clinical

## 2020-03-27 DIAGNOSIS — F410 Panic disorder [episodic paroxysmal anxiety] without agoraphobia: Secondary | ICD-10-CM

## 2020-03-27 DIAGNOSIS — F411 Generalized anxiety disorder: Secondary | ICD-10-CM

## 2020-03-27 DIAGNOSIS — F32A Depression, unspecified: Secondary | ICD-10-CM

## 2020-04-01 NOTE — Progress Notes (Signed)
   THERAPY PROGRESS NOTE  Session Time: 11:00-12pm Participation Level: Active Behavioral Response: CasualAlertEuthymic Type of Therapy: Individual Therapy Treatment Goals addressed: Anxiety and Coping   Purpose: LCSW met with client for routine individual therapy to work towards treatment goals: learning coping skills to manage anxiety symptoms.   Intervention: LCSW met with client for routine individual therapy to work towards treatment goals: learning coping skills to manage anxiety symptoms. LCSW provided patient opportunity to check in and to assess for any significant events and how she is doing today. LCSW utilized intervention of CBT as patient processed how she challenged a depressive thought and her mood by doing behavioral activation and processing her feelings. LCSW utilized handout CBT triangle to practice identifying helpful vs unhelpful anxiety thoughts and to demonstrate how she was able to practice CBT on her own when she began to experience depressive thoughts such as using CATCH, CHECK, and Change. LCSW provided patient challenging anxiety thoughts from anxiety manual to reference further on how she can challenge these thoughts. LCSW encouraged patient as she goes on her holidays to fully be present to be able to enjoy it. LCSW assessed for SI/HI/command psychosis.  Effectiveness: Patient is alert x4 affect. Patient identified today she is feeling good. Patient reports what has helped she has been entertained making her soaps, and doing designs on sweaters.   Reports 1 day she had a down day for no reason. Reports she woke up really tired on this day and had no motivation to do anything. Reports she tried to challenge it with skills learned in session and by praying. Reports what helped she ended up using skills of self talk to motivate herself to get up, went outside to the park, and decided to talk to her daughter what she was feeling. Patient reports she decided to tell her  daughter she has depression and anxiety as she began observe her mood and was asking questions. Patient reports she felt a sense of relief as she shared with her daughter her mental health. Patient shared she hopes this opened a window for her daughter to have trust in her to share with her when/if she experiences anything like this, patient reports doing this she felt being able to bond with daughter.   Patient participated in the CBT exercise and intervention was effective as patient was able to identify helpful vs unhelpful thoughts. Patient was able to recognize thoughts she experiences herself. Patient was able recognize how she used CBT on the day she was feeling down. Patient accepted praise from LCSW as she is beginning to use these skills to challenge her anxiety and depressive symptoms.   Patient reports she is looking forward to spending her holidays with her husbands family at the beach. Patient reports she enjoys the beach so she is excited for this vacation.   Progress towards goal is Ongoing. Patient denied active suicidal/homicidal/active psychosis.  Plan Patient offered next appointment for: 04/10/20 11am  Diagnosis: Generalized Anxiety Disorder w/panic    Lujean Rave, LCSW 04/01/2020  .

## 2020-04-10 ENCOUNTER — Other Ambulatory Visit: Payer: Self-pay | Admitting: Clinical

## 2020-04-11 ENCOUNTER — Ambulatory Visit: Payer: Self-pay | Admitting: Clinical

## 2020-04-11 ENCOUNTER — Other Ambulatory Visit: Payer: Self-pay

## 2020-04-11 DIAGNOSIS — F410 Panic disorder [episodic paroxysmal anxiety] without agoraphobia: Secondary | ICD-10-CM

## 2020-04-11 DIAGNOSIS — F411 Generalized anxiety disorder: Secondary | ICD-10-CM

## 2020-04-15 NOTE — Progress Notes (Signed)
   THERAPY PROGRESS NOTE  Session Time: 11-12pm Participation Level: Active Behavioral Response: CasualAlertEuthymic Type of Therapy: Individual Therapy Treatment Goals addressed: Anxiety and Coping   Purpose: LCSW met with client for routine individual therapy to work towards treatment goals: learning coping skills to manage anxiety.   Intervention:  LCSW met with client for routine individual therapy to work towards treatment goals: learning coping skills to manage anxiety. LCSW provided patient opportunity to check in to assess for any significant events and how she is doing today. LCSW utilized intervention of CBT and Strength-based as patient processed changes in reframing her thinking patterns and practice of anxiety skills to manage her symptoms. LCSW praised patient as she is practicing her skills. LCSW provided encouragement for patient to seek out gardens in the community to go to as she identified this is a safe and relaxation place for her. LCSW finished this session by faciliting a mindfulness exercise reading script of "mindfulness for anxious thinking". LCSW assessed for SI/HI/command psychosis.  Effectiveness: Patient is alert x4 affect. Patient identified today she is feeling well. Patient reports her trip to [place] with her family and her husband family great. Patient shared she faced her fears of trying to new things like sledding and identified a passion for nature as she visited places there. Patient processed when her son passed away, there at hospice, there was a lady who showed her how to garden on the gardens there. Reflecting back patient shared this woman was really special to her and can see how peaceful she feels being in nature.   Patient shared she has been practicing skills discussed in sessions recently at her eye doctor appointment, when she felt she was holding her breath and about to panic. Patient recognized she wasn't breathing and asked within herself why she  wasn't breathing. Patient reports she recognized her warning signs of panic from not breathing and her jaw being tense. Patient reports she practiced the C's, reframed her thinking she was not in danger, she regulated her breathing. By doing this patient reports she was able to decrease her anxiety symptoms.     Patient participated in mindfulness exercise from the script. Patient processed feeling relaxed following this exercise.  Intervention was effective as patient was able to reflect on her use of skills to manage her anxiety. Patient agreed on homework assignment to seek out gardens in the community. Progress towards goal is Ongoing. Patient denied active suicidal/homicidal/active psychosis.  Plan Patient offered next appointment for: 04/24/20 11am  Diagnosis: Generalized Anxiety Disorder w/panic    Lujean Rave, LCSW

## 2020-04-24 ENCOUNTER — Telehealth: Payer: Self-pay | Admitting: Clinical

## 2020-04-24 NOTE — Progress Notes (Signed)
LCSW attempted to begin counseling via virtual updox. However due unable to hear via updox and google voice (telephone) patient asked to be rescheduled for next week same time via in person. With the little bit of hearing, LCSW did ask for a check in, patient reports she was feeling sad because of her dads health but was okay to reschedule. Patient verbalized awareness to contact LCSW if needed sooner.

## 2020-05-01 ENCOUNTER — Ambulatory Visit: Payer: Self-pay | Admitting: Clinical

## 2020-05-01 ENCOUNTER — Other Ambulatory Visit: Payer: Self-pay

## 2020-05-01 DIAGNOSIS — F410 Panic disorder [episodic paroxysmal anxiety] without agoraphobia: Secondary | ICD-10-CM

## 2020-05-01 DIAGNOSIS — F411 Generalized anxiety disorder: Secondary | ICD-10-CM

## 2020-05-01 DIAGNOSIS — F32A Depression, unspecified: Secondary | ICD-10-CM
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
## 2020-05-08 ENCOUNTER — Other Ambulatory Visit: Payer: Self-pay

## 2020-05-08 ENCOUNTER — Ambulatory Visit (INDEPENDENT_AMBULATORY_CARE_PROVIDER_SITE_OTHER): Payer: Self-pay | Admitting: Clinical

## 2020-05-08 DIAGNOSIS — F410 Panic disorder [episodic paroxysmal anxiety] without agoraphobia: Secondary | ICD-10-CM

## 2020-05-08 DIAGNOSIS — F411 Generalized anxiety disorder: Secondary | ICD-10-CM

## 2020-05-08 DIAGNOSIS — F32A Depression, unspecified: Secondary | ICD-10-CM

## 2020-05-12 NOTE — Progress Notes (Signed)
THERAPY PROGRESS NOTE  Session Time: 11:00-12pm Participation Level: Active Behavioral Response: CasualAlert"numb" Type of Therapy: Individual Therapy Treatment Goals addressed: Anxiety and Coping with recent stressors.    Purpose: LCSW met with client for routine individual therapy to work towards treatment goals: anxiety and coping with recent stressors.   Intervention: LCSW met with client for routine individual therapy to work towards treatment goals: anxiety and coping with recent stressors. LCSW provided patient with a brief check in to assess how she has been since last session and to assess for any significant events. LCSW provided reflective listening skills as patient processed changes in her home and her family. LCSW utilized intervention of Supportive to decrease anxiety/depressive symptoms. LCSW provided patient words of encouragement with her courage to practice assertive communication with her husband. LCSW validated patients feelings following the passing of her father and discussed the different patterns of grief. LCSW discussed with patient a key point is taking care of herself and being surrounded around people of support, as patient shared concern not knowing what to focus on. LCSW discussed importance of self love. LCSW demonstrated an example of MSW interns and LCSW self love tips for ideas of self love. LCSW asked for patient to practice a self care/self love activity until next session. LCSW assessed for SI/HI/command psychosis.  Effectiveness: Patient is alert x4 affect. Patient reports today she feels numb. Patient shared feeling confused how to feel. Patient shared her father passed away on Saturday following problems with his lungs complications with COVID. Patient reports of course she feels sad but she sees how others have responded following his passing and she feels different. Patient processed her father was always hard headed he felt the need to be independent, so  she knows if he had come out and rely on an oxygen tank he wouldn't have been happy. Patient reports she feels peaceful following her last conversation with him and because of being far away the grief hasn't hit as hard compared to her mom and sister. Patient reports it was nice to see all of her family come together.   Patient shared update regarding recent conflict with her husband. Patient reports after session she spoke with her husband she was no longer going to let him make her feel any less than. Patient shared her husband respected her conversation and has then demonstrated change and apologized to her. Patient reported awareness that this is only progress, acknowledged she does have fear it could happen again. LCSW validated her feeling regarding this and just discussed trying to focus on current.  In regards to the confusion what to focus on, LCSW encouraged to think of maybe focusing on herself and wellbeing. Patient shared she liked talking with her cousin who provided support recently, and they both had discussed importance of keeping in touch. Patient reports she will reach out to her and probably do something together.   Intervention was effective as patient was able to reflect and participated in the session. Patient shared gratitude with words of encouragement received from LCSW. Progress towards goal is Ongoing. Patient denied active suicidal/homicidal/active psychosis.  Plan Patient offered next appointment for: 02/17 11am  Diagnosis: GAD w/panic; unspecified depressive disorder    Tiffany P Tol, LCSW 05/12/2020   

## 2020-05-22 ENCOUNTER — Ambulatory Visit (INDEPENDENT_AMBULATORY_CARE_PROVIDER_SITE_OTHER): Payer: Self-pay | Admitting: Clinical

## 2020-05-22 ENCOUNTER — Other Ambulatory Visit: Payer: Self-pay

## 2020-05-22 DIAGNOSIS — F411 Generalized anxiety disorder: Secondary | ICD-10-CM

## 2020-05-22 DIAGNOSIS — F410 Panic disorder [episodic paroxysmal anxiety] without agoraphobia: Secondary | ICD-10-CM
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
## 2020-06-01 ENCOUNTER — Ambulatory Visit (HOSPITAL_COMMUNITY)
Admission: EM | Admit: 2020-06-01 | Discharge: 2020-06-01 | Disposition: A | Discharge status: Home / Self Care | Payer: Self-pay | Attending: Emergency Medicine | Admitting: Emergency Medicine

## 2020-06-01 ENCOUNTER — Encounter (HOSPITAL_COMMUNITY): Payer: Self-pay

## 2020-06-01 ENCOUNTER — Other Ambulatory Visit: Payer: Self-pay

## 2020-06-01 DIAGNOSIS — H1033 Unspecified acute conjunctivitis, bilateral: Secondary | ICD-10-CM

## 2020-06-01 DIAGNOSIS — R030 Elevated blood-pressure reading, without diagnosis of hypertension: Secondary | ICD-10-CM

## 2020-06-01 MED ORDER — POLYMYXIN B-TRIMETHOPRIM 10000-0.1 UNIT/ML-% OP SOLN: 1.0000 [drp] | Freq: Four times a day (QID) | OPHTHALMIC | 0 refills | Status: AC

## 2020-06-01 NOTE — Discharge Instructions (Addendum)
Use the antibiotic eyedrops as prescribed.    Follow-up with your eye doctor for a recheck in 1 to 2 days if your symptoms are not improving.    Go to the emergency department if you have acute eye pain, changes in your vision, or other concerning symptoms.   Your blood pressure is elevated today at 157/83.  Please have this rechecked by your primary care provider in 2-4 weeks.

## 2020-06-01 NOTE — ED Triage Notes (Addendum)
Pt present left eye irritation, pt states on Friday her Left eye was itchy and watery. There is also some red tissue that is poking out the corner of pt left eye

## 2020-06-01 NOTE — ED Provider Notes (Signed)
[facility]    CSN: [id number] Arrival date & time: 06/01/20  1008      History   Chief Complaint Chief Complaint  Patient presents with  . Eye Drainage    Left     HPI Tiffany [name] Derrell Lolling is a 45 y.o. female.   Patient presents with bilateral eye redness, itching, irritation x2 days.  The left eye is worse than the right.  She denies acute eye pain or changes in her vision.  She denies fever, chills, congestion, sore throat, cough, or other symptoms.  No treatments attempted at home.  She reports her last eye exam was 1 month ago.  She denies pertinent medical history.   The history is provided by the patient and medical records.    History reviewed. No pertinent past medical history.  There are no problems to display for this patient.   History reviewed. No pertinent surgical history.  OB History   No obstetric history on file.      Home Medications    Prior to Admission medications   Medication Sig Start Date End Date Taking? Authorizing Provider  trimethoprim-polymyxin b (POLYTRIM) ophthalmic solution Place 1 drop into both eyes 4 (four) times daily for 7 days. 06/01/20 06/08/20 Yes Mickie Bail, NP    Family History History reviewed. No pertinent family history.  Social History     Allergies   Patient has no known allergies.   Review of Systems Review of Systems  Constitutional: Negative for chills and fever.  HENT: Negative for ear pain and sore throat.   Eyes: Positive for photophobia, discharge, redness and itching. Negative for pain and visual disturbance.  Respiratory: Negative for cough and shortness of breath.   Cardiovascular: Negative for chest pain and palpitations.  Gastrointestinal: Negative for abdominal pain and vomiting.  Genitourinary: Negative for dysuria and hematuria.  Musculoskeletal: Negative for arthralgias and back pain.  Skin: Negative for color change and rash.  Neurological: Negative for seizures and syncope.   All other systems reviewed and are negative.    Physical Exam Triage Vital Signs ED Triage Vitals  Enc Vitals Group     BP      Pulse      Resp      Temp      Temp src      SpO2      Weight      Height      Head Circumference      Peak Flow      Pain Score      Pain Loc      Pain Edu?      Excl. in GC?    No data found.  Updated Vital Signs BP (!) 157/83 (BP Location: Right Arm)   Pulse 84   Temp 99.3 F (37.4 C) (Oral)   Resp 16   LMP 05/23/2020   SpO2 98%   Visual Acuity Right Eye Distance: 20/20 with glasses  Left Eye Distance: 20/20 with glasses Bilateral Distance:    Right Eye Near:   Left Eye Near:    Bilateral Near:     Physical Exam Vitals and nursing note reviewed.  Constitutional:      General: She is not in acute distress.    Appearance: She is well-developed and well-nourished.  HENT:     Head: Normocephalic and atraumatic.     Mouth/Throat:     Mouth: Mucous membranes are moist.  Eyes:     General: Lids are  normal. Vision grossly intact.        Right eye: Discharge present.        Left eye: Discharge present.    Extraocular Movements: Extraocular movements intact.     Conjunctiva/sclera:     Right eye: Right conjunctiva is injected.     Left eye: Left conjunctiva is injected.     Pupils: Pupils are equal, round, and reactive to light.     Comments: Left eye more injected than right.  Green-yellow drainage accumulated in bilateral inner canthus.  Left inner lacrimal caruncle inflamed.  Cardiovascular:     Rate and Rhythm: Normal rate and regular rhythm.     Heart sounds: Normal heart sounds.  Pulmonary:     Effort: Pulmonary effort is normal. No respiratory distress.     Breath sounds: Normal breath sounds.  Abdominal:     Palpations: Abdomen is soft.     Tenderness: There is no abdominal tenderness.  Musculoskeletal:        General: No edema.     Cervical back: Neck supple.  Skin:    General: Skin is warm and dry.   Neurological:     General: No focal deficit present.     Mental Status: She is alert and oriented to person, place, and time.     Gait: Gait normal.  Psychiatric:        Mood and Affect: Mood and affect and mood normal.        Behavior: Behavior normal.      UC Treatments / Results  Labs (all labs ordered are listed, but only abnormal results are displayed) Labs Reviewed - No data to display  EKG   Radiology No results found.  Procedures Procedures (including critical care time)  Medications Ordered in UC Medications - No data to display  Initial Impression / Assessment and Plan / UC Course  I have reviewed the triage vital signs and the nursing notes.  Pertinent labs & imaging results that were available during my care of the patient were reviewed by me and considered in my medical decision making (see chart for details).   Acute bacterial conjunctivitis bilaterally.  Elevated blood pressure reading.  Treating with Polytrim eyedrops.  Instructed patient to follow-up with her eye care provider in 1 to 2 days if her symptoms or not improving.  Discussed precautions for ED visit including eye pain, changes in vision, or other concerning symptoms.  Discussed that her blood pressure is elevated today and needs to be rechecked by her PCP in 2 to 4 weeks.  She agrees to plan of care.   Final Clinical Impressions(s) / UC Diagnoses   Final diagnoses:  Acute bacterial conjunctivitis of both eyes  Elevated blood pressure reading     Discharge Instructions     Use the antibiotic eyedrops as prescribed.    Follow-up with your eye doctor for a recheck in 1 to 2 days if your symptoms are not improving.    Go to the emergency department if you have acute eye pain, changes in your vision, or other concerning symptoms.   Your blood pressure is elevated today at 157/83.  Please have this rechecked by your primary care provider in 2-4 weeks.         ED Prescriptions     Medication Sig Dispense Auth. Provider   trimethoprim-polymyxin b (POLYTRIM) ophthalmic solution Place 1 drop into both eyes 4 (four) times daily for 7 days. 10 mL Mickie Bail, NP  PDMP not reviewed this encounter.   Mickie Bail, NP 06/01/20 1123

## 2020-06-03 ENCOUNTER — Telehealth: Payer: Self-pay | Admitting: Clinical

## 2020-06-03 NOTE — Telephone Encounter (Signed)
LCSW contacted to reschedule. Pt shared when she went to urgent care for her pink eye she was informed her blood pressure was high 157/83. Pt reports although it may have been due to her anxiety related to her high blood pressure she was wondering if she should be seen for BP check.   LCSW discussed with receptionist, and due to upcoming appt recommended to ask if she wants wait for her April appt or wants something sooner.   Pt reports again blood pressure may have been a factor of her anxiety. Reports she is feeling better on this currently. Pt agreed to observe any symptoms and inform clinic if any concerns related to blood pressure. Pt informed she may reach out to clinic to see if something sooner as her husbands company are in discussion to move out of state for 6 months.

## 2020-06-04 ENCOUNTER — Encounter: Payer: Self-pay | Admitting: Internal Medicine

## 2020-06-05 ENCOUNTER — Other Ambulatory Visit: Payer: Self-pay | Admitting: Clinical

## 2020-06-12 ENCOUNTER — Ambulatory Visit (INDEPENDENT_AMBULATORY_CARE_PROVIDER_SITE_OTHER): Payer: Self-pay | Admitting: Clinical

## 2020-06-12 ENCOUNTER — Other Ambulatory Visit: Payer: Self-pay

## 2020-06-12 DIAGNOSIS — F411 Generalized anxiety disorder: Secondary | ICD-10-CM

## 2020-06-12 DIAGNOSIS — F410 Panic disorder [episodic paroxysmal anxiety] without agoraphobia: Secondary | ICD-10-CM

## 2020-06-16 NOTE — Progress Notes (Signed)
   THERAPY PROGRESS NOTE  Session Time: 11-12pm Participation Level: Active Behavioral Response: CasualAlertEuthymic Type of Therapy: Individual Therapy Treatment Goals addressed: Coping skills to manage anxiety symptoms    Purpose: LCSW met with client for routine individual therapy to work towards treatment goals: Coping skills to manage anxiety symptoms   Intervention: LCSW met with client for routine individual therapy to work towards treatment goals. LCSW provided patient opportunity to check in to assess for any significant events and assessed how she is doing today. LCSW utilized intervention of Strength Based as patient identified improved mood as she is practicing her coping skills and engaging in social activities. LCSW praised patient on her practice of skills. LCSW and patient discussed possible change in the upcoming months and encouraged her to continue to do her work as this is something she enjoys. 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 LCSW assessed for SI/HI/command psychosis.  Effectiveness: Patient is alert x4 affect. Patient reports today she is feeling great and happy. Patient reports she just went to [place] with a friend and her daughter. Patient reports on Tuesday she met with a friend from [place] who she had not seen in a long time. And she is looking forward to Sunday as she is going to a concert with her cousins. Patient reports she has also been active in her yard by cleaning and inside organizing as well as making new orders.   Patient reports keeping busy has helped tremendously. Patient reports increase of self esteem following orders from others feedbacks. Patient shared in the session her first soaps she made compared to now. Patient shared it feels nice to see the comparison of her progress. Patient related this back to her treatment by practicing skills and learning she can recognize her own self growth. 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   . Intervention was effective as patient was able to reflect on her overall progress. Patient was receptive to idea of viewing her concert as a mini vacation. Progress towards goal is Ongoing. Patient denied active suicidal/homicidal/active psychosis.  Plan Patient offered next appointment for: 06/26/20 12pm  Diagnosis: GAD w/panic    Lujean Rave, LCSW 06/16/2020

## 2020-06-26 ENCOUNTER — Ambulatory Visit: Payer: Self-pay | Admitting: Clinical

## 2020-06-26 ENCOUNTER — Other Ambulatory Visit: Payer: Self-pay

## 2020-06-26 DIAGNOSIS — F410 Panic disorder [episodic paroxysmal anxiety] without agoraphobia: Secondary | ICD-10-CM

## 2020-06-26 DIAGNOSIS — F411 Generalized anxiety disorder: Secondary | ICD-10-CM

## 2020-06-30 NOTE — Progress Notes (Signed)
   THERAPY PROGRESS NOTE  Session Time: 12:00p-1pm Participation Level: Active Behavioral Response: CasualAlertEuthymic Type of Therapy: Individual Therapy Treatment Goals addressed: Coping skills to manage anxiety symptoms   Purpose: LCSW met with client for routine individual therapy to work towards treatment goals: Coping skills to manage anxiety symptoms  Intervention: LCSW met with patient for routine individual therapy. LCSW provided patient opportunity to check in to assess for any significant events and how she is today. LCSW assessed for any coping skills used. In this session patient processed her thoughts related to the move. 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 It should be noted this session was facilitated as a termination session and reviewed treatment goals. LCSW praised patient as she shared reflection on her growth. LCSW provided patient one more homework that can be used anywhere which is creating a playlist with music of emotions. LCSW congratulated patient on reaching her treatment goals and informed she is welcome to return if ever needed. LCSW assessed for SI/HI/command psychosis.  Effectiveness: Patient is alert x4 affect.  Patient reported accidentally coming earlier for session so she went to the local garden near the clinic. Patient reports during check in by visiting the garden she is feeling relaxed. Patient processed she is excited for the temporarily move and is just processing the upcoming change and trying to view it at a approach of a long vacation. Patient reports she has begun to pack and look up routes for her husband and has looked around the local garden where she will be. Patient shared she is looking forward to visiting [place].   Patient shared coping skills since last session was going to the concert with her cousins, talking  with her cousins and friends. Patient shared awareness of the benefits of social support. Patient shared by talking to others has helped her express any thoughts. Patient shared no recent anxiety as she has maintained her coping skills. Patient shared her love for music and agreed to develop a playlist of music with emotions. Patient expressed gratitude to LCSW for treatment. LCSW thanked patient for letting LCSW the opportunity to be part of her treatment journey.   Progress towards goal is Achieved. Patient denied active suicidal/homicidal/active psychosis.  Plan Patient offered next appointment for: Patient expressed understanding she is welcome if needed.  Diagnosis: GAD w/panic    Lujean Rave, LCSW 06/30/2020

## 2020-08-01 ENCOUNTER — Ambulatory Visit: Payer: Self-pay | Admitting: Internal Medicine

## 2020-12-14 ENCOUNTER — Encounter: Payer: Self-pay | Admitting: Internal Medicine

## 2020-12-14 DIAGNOSIS — F419 Anxiety disorder, unspecified: Secondary | ICD-10-CM | POA: Insufficient documentation

## 2022-06-23 ENCOUNTER — Telehealth: Payer: Self-pay

## 2022-06-23 NOTE — Telephone Encounter (Signed)
Patient called asking for her medical records printed. Patient is aware of charge.

## 2022-07-20 NOTE — Telephone Encounter (Signed)
Patient has been notified that medical release is ready for pick up.
